# Patient Record
Sex: Male | Born: 1975 | Race: White | Hispanic: No | Marital: Single
Health system: Midwestern US, Academic
[De-identification: ages and names within clinical notes are randomized; demographics above are authoritative.]

## PROBLEM LIST (undated history)

## (undated) LAB — GLUCOSE POC NURSING
POC Glucose: 124
POC Glucose: 153
POC Glucose: 224

---

## 2005-06-21 NOTE — Unmapped (Signed)
THE [facility] PATIENT NAME:   Paul Maynard, Paul Maynard              MR #:  [id number] DATE OF BIRTH:  [birth year]                           ACCOUNT #:  [id number] ED PHYSICIAN:   Salvatore Decent. Marylou Flesher, M.D.             ROOM #: PRIMARY:        Scarlette Calico, M.D.             NURSING   UNIT:  ED REFERRING:      Selected Referral Pt               FC:  M DICTATED   BY:    Marcos Eke, M.D.                     ADMIT DATE:  06/21/2005 VISIT DATE:       06/21/2005                         DISCHARGE DATE:                         EMERGENCY DEPARTMENT DISCHARGE NOTE CHIEF COMPLAINT:  Boil. HISTORY OF   PRESENT ILLNESS:  This is a 30 year old, otherwise healthy male who comes in   today with a left buttocks abscess.  He says that this developed over the past   three to four days.  He has been afebrile and has had abscesses before.    Aside from the abscess he has no complaints. He notes significant pain when he   is sitting on the abscess and notes that he does not have any current   abscesses.  He denies any other recent sick contacts.  Denies any recent   animal or spider bites and has no other complaints. REVIEW OF SYSTEMS:    Positive for left buttocks abscess.  Negative for difficulty with bowel   movements.  Negative for difficulty urinating. Negative for chest pain,   shortness of breath, other abscesses, rash.  All other systems are negative.   PAST MEDICAL HISTORY:  None. PAST SURGICAL HISTORY:  None. ALLERGIES:  None.   MEDICATIONS:  None. SOCIAL HISTORY:  The patient denies tobacco, alcohol or   drugs. PHYSICAL EXAMINATION: VITAL SIGNS:  Blood pressure 126/73, pulse 95,   respiratory rate 16, temperature 99.0, O2 sat 97% on room air. GENERAL:  This   is a well-appearing male in no apparent distress. SKIN:  Focused physical   exam, left buttocks, the patient has a 3 cm x 3 cm area of induration on his   left buttocks.  It does not extend into or around the rectum.  There is a   minimal  area of fluctuance noted. EMERGENCY DEPARTMENT COURSE:  The patient   was seen and evaluated by myself and the attending.  Given the buttocks   abscess, I performed a rectal exam.  I did not note any perirectal or perianal   involvement.  I then performed an incision and drainage of the buttocks   abscess after anesthetizing it with 1% lidocaine with epinephrine (please see   procedure note below). I have given the patient Bactrim and Keflex here given   the fact that he has a  history of abscesses and we also gave him one liter of   normal saline and Phenergan because of the fact that he was feeling some   nausea at the thought of the abscess. The patient will return in five days and   either be rechecked here or at his primary care physician. LABORATORY DATA:    Wound culture pending. MEDICAL DECISION MAKING:  This is a 30 year old male   with a left buttocks abscess that has been cultured.  There is a small amount   of drainage and so he is started on Keflex and Bactrim.  He will followup with   either [place] or his primary care physician in five days for wound recheck and   packing material removal. INVASIVE PROCEDURE NOTE:  Abscess drainage.   INDICATIONS:  Abscess. DETAILS OF PROCEDURE:  Preparation:  The area was   anesthetized with 1% lidocaine with epinephrine and cleaned with chlorhexidine   scrub.  Using a #11 scalpel I incised a 1 cm incision into the middle of the   abscess and drained a small amount of pus and sent a wound culture.  I then   packed it with approximately 5 cm of packing material and dressed it   sterilely. COMPLICATIONS:  None. ESTIMATED BLOOD LOSS:  2 cc. DIAGNOSIS: 1.   Abscess. CONDITION:  Stable. DISPOSITION:  Home. PLAN: 1. Followup as   necessary with primary care physician or the emergency    department in five   days for a wound recheck. 2. Keflex and Bactrim for seven days.                                         ________________________________________ AR/dla                                   ____ D:  06/21/2005 11:46                  Marcos Eke,   M.D. T:  06/21/2005 15:58 Job #:  [id number]                                         ________________________________________                                         ____                                       Salvatore Decent. Marylou Flesher, M.D.                         EMERGENCY DEPARTMENT DISCHARGE NOTE                                         COPY                   PAGE    1 of 1
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
## 2005-10-09 NOTE — Unmapped (Signed)
THE [facility] PATIENT NAME:   Paul Maynard, Paul Maynard                 MR #:  [id number] DATE OF BIRTH:  10-Mar-1975                           ACCOUNT #:  [id number] ED PHYSICIAN:   Abelardo Diesel, M.D.                ROOM #: PRIMARY:        Scarlette Calico, M.D.             NURSING   UNIT:  ED REFERRING:      Referring Nonstaff                 FC:  M DICTATED   BY:    Abelardo Diesel, M.D.              ADMIT DATE:  10/09/2005 VISIT DATE:                                          DISCHARGE DATE:                         EMERGENCY DEPARTMENT DISCHARGE NOTE CHIEF COMPLAINT:  Neck pain. HISTORY OF   PRESENT ILLNESS:  This is a 30 year old gentleman who states that he was   picking his cousin up out of a wheelchair, who weighs approximately 90 pounds,   and strained his neck.  He denies any other blunt trauma, chest pain,   shortness of breath, abdominal pain, numbness, tingling or weakness.  He   describes the pain as 10/10 and worse with movement.  Relieved with rest.  He   has taken over-the-counter Motrin because he has taken this also for his hip.   He denies any fevers, chest pain, shortness of breath, numbness, tingling, or   weakness. REVIEW OF SYSTEMS:  All other review of systems are negative. PAST   MEDICAL HISTORY: 1. Chronic hip pain. MEDICATIONS: 1. Motrin. ALLERGIES:    None. SOCIAL HISTORY:  The patient denies tobacco, ethanol or drug use.   PHYSICAL EXAMINATION: VITAL SIGNS:  Blood pressure 129/79, pulse 96,   respirations 18, temperature 97.2, O2 saturation 97%. GENERAL:    Well-developed, well-nourished gentleman in no significant distress. NECK:    Supple.  Full range of motion, somewhat limited by pain.  No midline spinous   process tenderness.  The patient appears somewhat stiff, however, does not   have any palpable paraspinal musculature spasm. CARDIOVASCULAR:  Regular rate   and rhythm.  No murmurs. CHEST:  Clear to auscultation bilaterally.  No   wheezes, rubs or  rhonchi. ABDOMEN:  Very soft, nontender, nondistended,   normoactive bowel sounds. EXTREMITIES:  The patient has mild tenderness of the   right hip with flexion and extension and otherwise negative. EXTREMITIES:  No   clubbing, cyanosis, or edema. SKIN:  No rashes or petechia. NEUROLOGIC:  The   patient has equal strength in bilateral upper and lower extremities, equal   reflexes.  No sensory deficit noted. PSYCHIATRIC:  Appropriate mood and   affect. MEDICAL DECISION MAKING:  The patient's medical records were reviewed.    It appears as though he comes to the emergency department approximately  twice per month for pain in his right hip.  The patient is currently under the   care of Dr. Hedy Jacob, as well as will have an appointment with orthopedics   for chronic hip pain.  Currently, the patient has no significant inciting   trauma or focal neurologic deficit that would suggest the need for   radiography. ASSESSMENT: 1. Acute neck strain. PLAN: 1. The patient will be   discharged home. 2. The patient will be given prescription for Motrin 800 mg   one by mouth    every eight hours as needed for pain. 3. Flexeril 10 mg one by   mouth three times per day as needed for spasm. DISPOSITION:  The patient was   discharged home in stable condition to follow up with Dr. Hedy Jacob,   orthopedics, physical medicine, and rehabilitation.                                         ________________________________________ AB/rr                                   ____ D:  10/09/2005 15:45                  Abelardo Diesel, M.D.   T:  10/09/2005 22:59 Job #:  [id number]                       EMERGENCY   DEPARTMENT DISCHARGE NOTE                                       COPY                     PAGE    1 of 1                                       COPY                     PAGE    1 of 1
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
## 2007-08-16 NOTE — Unmapped (Signed)
Signed by   LinkLogic on 08/17/2007 at 11:23:27  Patient: Paul Maynard  Note: All result statuses are Final unless otherwise noted.    Tests: (1) DIAG-SPINE ENTIRE SURV AP&LAT [phone])    Order NotePricilla Handler Order Number: [id number]    Order Note:     *** VERIFIED [facility]  Reason:  FALL FROM LADDER/PAIN  Dict.Staff: Lum Babe [phone]  Dict.Res: Caryl Asp [id number]  Verified By: Lum Babe    Ver: 08/17/07  11:23 am  Exams:  DIAG-CHEST PA OR AP    Single radiograph of the chest, 2 radiographs of the left knee,  3 radiographs of the cervical spine, 2 radiographs of the  thoracic spine, and 2 radiographs of the lumbar spine dated  08/16/2007.    Indication: Fall from ladder.    Comparison: Lumbar spine radiographs dated 09/06/2005, left knee  radiographs dated 04/28/2005.    Findings chest:  There is no focal consolidation, pleural effusion, or evidence  of pneumothorax. There is a well-defined 7 mm density in the  right lower lobe, likely representing a calcified granuloma. The  cardiomediastinal silhouette is normal in appearance. The  visualized osseous structures demonstrate no acute abnormalities.    Impression chest:  1. No acute cardiopulmonary disease.        Findings left knee:  There is osteophytosis involving the patella. There is sclerosis  of the patella. Additionally, there is osteophytosis involving  the medial and lateral compartments. There are multiple well  corticated ossific fragments adjacent to the superior lateral  femoral condyle as well there is a smaller ossific fragment in  the medial compartment. There is ossific fragments posterior to  the proximal fibula. These likely represent loose bodies. There  is no evidence of acute fracture or dislocation.    Impression left knee:  1. No evidence of acute fracture or dislocation.  2. Moderate anterior compartment arthrosis and mild medial and  lateral compartment arthrosis.      Findings cervical spine:  Skullbase  to C5 are visualized. The open-mouth odontoid view is  limited. There is normal alignment of the visualized cervical  spine. Note is normal vertebral body and disc space heights.  There is no prevertebral soft tissue swelling. There is no  evidence of fracture or subluxation involving the visualized  cervical spine.    Impression cervical spine:  Incomplete evaluation demonstrates no acute fracture or  subluxation of the visualized cervical spine.      Findings thoracic spine:  The inferior most completely visualized vertebral body on the  lateral radiograph is presumably the T12 vertebral body. In  which case, T4-T12 vertebral bodies are visualized with markedly  limited visualization of the posterior elements of the superior  most thoracic vertebral bodies. There is grossly normal  alignment of the visualized thoracic spine. There is normal  vertebral body and disc space heights. Evaluation for subtle  fractures is markedly limited.    Impression thoracic spine:  Limited examination of the visualized thoracic spine    demonstrates no gross subluxation or compression fracture.      Findings lumbar spine:  T11 through S1 are visualized. There are 5 nonrib-bearing lumbar  vertebrae. There is normal alignment of the lumbar spine. There  is normal vertebral body and disc space heights. Surgical  fixation is partially visualized at the S1-S2 level. There is  facet arthropathy involving L5-S1. There is no evidence of acute  fracture or subluxation.    Impression lumbar spine:  1. No definite acute fracture or subluxation.  2. Surgical fixation, involving the right hemipelvis.  3. L5-S1 facet arthropathy.        The Initial report of this examination was provided by a  ***Resident Radiologist***    This report becomes final only after image review and verified  report signature by the Staff Radiologist.  **** end of result ****    Order Note:   EMR Routing to: Amyrah Pinkhasov, Oris Drone, MD - ordering - [id number]    Note: An  exclamation mark (!) indicates a result that was not dispersed into   the flowsheet.  Document Creation Date: 08/17/2007 11:23 AM  _______________________________________________________________________    (1) Order result status: Final  Collection or observation date-time: 08/16/2007 16:51:21  Requested date-time: 08/16/2007 16:14:00  Receipt date-time:   Reported date-time: 08/17/2007 11:23:21  Referring Physician: SELECTED REFERRAL PT  Ordering Physician: Daron Offer (BALESBD)  Specimen Source:   Source: QRS  Filler Order Number: [id number]  Lab site: [facility]

## 2007-08-16 NOTE — Unmapped (Signed)
Signed by   LinkLogic on 08/16/2007 at 21:11:32  Patient: Paul Maynard  Note: All result statuses are Final unless otherwise noted.    Tests: (1)  (MR)    Order Note:                                   THE [facility]     PATIENT NAME:   Paul Maynard, Paul Maynard                MR #:  [id number]  DATE OF BIRTH:  October 08, 1975                        ACCOUNT #:  [id number]  ED PHYSICIAN:   Gareth Morgan, M.D.              ROOM #:  PRIMARY:        Marya Landry. Lenise Arena, M.D.            NURSING UNIT:  ED  REFERRING:      Selected Referral Pt              FC:  G  DICTATED BY:    Oris Drone. Cyndi Bender, M.D.              ADMIT DATE:  08/16/2007  VISIT DATE:     08/16/2007                        DISCHARGE DATE:                           EMERGENCY DEPARTMENT ADMISSION NOTE        CHIEF COMPLAINT:  Back and left knee pain status post fall.     HISTORY OF PRESENT ILLNESS:  The patient is a 32 year old male who states  that he was up working on some gutters today.  He states that he fell off the  top of a ladder.  He is unsure exactly how tall the ladder was, that he was  up around one story high.  The patient states that he fell backwards onto his  back.  He did not hit his head.  There was no loss of consciousness.  He  states that he was able to get up and walk around at the scene.  He states  that he had some pain in his back, which was diffuse in nature.  He has had  no numbness or tingling in his upper or lower extremities.  He has since had  no urinary or bowel incontinence.  He states that he simply had pain in his  back and felt that he would need further evaluation and was transferred to  the emergency department by his daughter in private vehicle.  He currently  states that his pain is a 7/10 located in his back diffusely.  He states that  he has had no recent illness and is otherwise well.     PAST MEDICAL HISTORY:  None.     MEDICATIONS:  None.     ALLERGIES:  None.     FAMILY HISTORY:  Noncontributory.     SOCIAL HISTORY:  The  patient denies tobacco, alcohol or illicit drug use.     REVIEW OF SYSTEMS:  As per HPI, other systems reviewed and negative.     PHYSICAL EXAMINATION:  VITAL SIGNS:  Temperature 97.1, blood pressure 119/81, pulse 76, respiratory  rate 18, pulse ox 100% on room air.  GENERAL:  The patient is awake, alert, oriented x3, appears to be in no acute  distress.  HEENT:  His pupils are equal and reactive to light bilaterally.  Extraocular  muscles intact.  Nares and oropharynx clear.  NECK:  Noted to be in a C-collar.  When the patient is laid flat and the  cervical spine is palpated he does have some diffuse tenderness most notably  at the most inferior cervical spine.  He does state that he was able to  rotate his neck prior to arriving in the emergency department.  CHEST:  Clear to auscultation bilaterally, no wheezes, rhonchi or rales.  There is no evidence of trauma.  CARDIOVASCULAR:  Regular rate and rhythm, no murmurs, rubs, gallops.  He has  2+ pedal pulses noted bilaterally.  ABDOMEN:  Soft, nontender, nondistended, without rebound or guarding.  Once  again, no evidence of trauma.  Bowel sounds are heard in all four quadrants.  EXTREMITIES:  Warm and well-perfused.  No pitting, cyanosis, edema.  There is  no notable bony injury or gross deformity on physical examination.  He has  full range of motion in his upper and lower extremities bilaterally.  NEUROLOGIC:  Cranial nerves II through XII are intact.  He has 5/5 strength  in his upper and lower extremities bilaterally.  Was noted to have a normal  gait prior to my evaluation while he was walking here in the emergency  department.  He has 2+ reflexes in upper and lower extremities.     EMERGENCY DEPARTMENT COURSE:  The patient was seen and evaluated by myself  and the attending.  History and physical obtained.  Plan discussed and agreed  upon.     X-RAYS/CT SCANS:  The patient had films of his cervical, thoracic, lumbar and  sacral spine as well as a chest  x-ray and a left knee film, as he did state  that he had some left knee pain.  All of the patient's films appear to be  negative, though his cervical spine films were inadequate beyond the level of  C5.  Therefore, he has a CT of his C-spine pending.  If this is negative, I  do feel that the patient will be discharged  home in stable condition.     IMPRESSION:     1. Back pain status post fall.  2. His care will be turned over to the oncoming resident at this time for    follow up of his CT C-spine.  Once again, he is neurovascularly intact.  He    has no notable evidence of trauma on physical examination.  His vital signs    are within normal limits.  He is well-appearing.  He will be given strict    instructions to return for any worsening symptoms or concerns including    numbness, tingling of the upper or lower extremities, bowel or bladder    incontinence, any dizziness, lightheadedness or changes in mental status.                                                       _______________________________________  BDB/plh  _____  D:  08/16/2007 20:24                  Oris Drone. Cyndi Bender, M.D.  T:  08/16/2007 20:58  Job #:  [id number]                                        _______________________________________                                         _____                                        Gareth Morgan, M.D.                              EMERGENCY DEPARTMENT ADMISSION NOTE                                                               PAGE    1 of   1    Note: An exclamation mark (!) indicates a result that was not dispersed into   the flowsheet.  Document Creation Date: 08/16/2007 9:11 PM  _______________________________________________________________________    (1) Order result status: Final  Collection or observation date-time: 08/16/2007 00:00  Requested date-time:   Receipt date-time:   Reported date-time:   Referring Physician: Selected Pt  Ordering Physician:  Reviewed In Hospital  [facility])  Specimen Source:   Source: DBS  Filler Order Number: [id number]  Lab site:

## 2007-08-16 NOTE — Unmapped (Signed)
THE [facility]     PATIENT NAME:   Paul Maynard, Paul Maynard                MR #:  [id number]   DATE OF BIRTH:  Apr 17, 1975                        ACCOUNT #:  [id number]   ED PHYSICIAN:   Gareth Morgan, M.D.              ROOM #:   PRIMARY:        Marya Landry. Lenise Arena, M.D.            NURSING UNIT:  ED   REFERRING:      Selected Referral Pt              FC:  G   DICTATED BY:    Oris Drone. Cyndi Bender, M.D.              ADMIT DATE:  08/16/2007   VISIT DATE:     08/16/2007                        DISCHARGE DATE:                           EMERGENCY DEPARTMENT ADMISSION NOTE       CHIEF COMPLAINT:  Back and left knee pain status post fall.     HISTORY OF PRESENT ILLNESS:  The patient is a 32 year old male who states   that he was up working on some gutters today.  He states that he fell off the   top of a ladder.  He is unsure exactly how tall the ladder was, that he was   up around one story high.  The patient states that he fell backwards onto his   back.  He did not hit his head.  There was no loss of consciousness.  He   states that he was able to get up and walk around at the scene.  He states   that he had some pain in his back, which was diffuse in nature.  He has had   no numbness or tingling in his upper or lower extremities.  He has since had   no urinary or bowel incontinence.  He states that he simply had pain in his   back and felt that he would need further evaluation and was transferred to   the emergency department by his daughter in private vehicle.  He currently   states that his pain is a 7/10 located in his back diffusely.  He states that   he has had no recent illness and is otherwise well.     PAST MEDICAL HISTORY:  None.     MEDICATIONS:  None.     ALLERGIES:  None.     FAMILY HISTORY:  Noncontributory.     SOCIAL HISTORY:  The patient denies tobacco, alcohol or illicit drug use.     REVIEW OF SYSTEMS:  As per HPI, other systems reviewed and negative.     PHYSICAL EXAMINATION:     VITAL SIGNS:  Temperature 97.1, blood pressure 119/81, pulse 76, respiratory   rate 18, pulse ox 100% on room air.   GENERAL:  The patient is awake, alert, oriented x3, appears to be in no acute   distress.   HEENT:  His pupils are equal  and reactive to light bilaterally.  Extraocular   muscles intact.  Nares and oropharynx clear.   NECK:  Noted to be in a C-collar.  When the patient is laid flat and the   cervical spine is palpated he does have some diffuse tenderness most notably   at the most inferior cervical spine.  He does state that he was able to   rotate his neck prior to arriving in the emergency department.   CHEST:  Clear to auscultation bilaterally, no wheezes, rhonchi or rales.   There is no evidence of trauma.   CARDIOVASCULAR:  Regular rate and rhythm, no murmurs, rubs, gallops.  He has   2+ pedal pulses noted bilaterally.   ABDOMEN:  Soft, nontender, nondistended, without rebound or guarding.  Once   again, no evidence of trauma.  Bowel sounds are heard in all four quadrants.   EXTREMITIES:  Warm and well-perfused.  No pitting, cyanosis, edema.  There is   no notable bony injury or gross deformity on physical examination.  He has   full range of motion in his upper and lower extremities bilaterally.   NEUROLOGIC:  Cranial nerves II through XII are intact.  He has 5/5 strength   in his upper and lower extremities bilaterally.  Was noted to have a normal   gait prior to my evaluation while he was walking here in the emergency   department.  He has 2+ reflexes in upper and lower extremities.     EMERGENCY DEPARTMENT COURSE:  The patient was seen and evaluated by myself   and the attending.  History and physical obtained.  Plan discussed and agreed   upon.     X-RAYS/CT SCANS:  The patient had films of his cervical, thoracic, lumbar and   sacral spine as well as a chest x-ray and a left knee film, as he did state   that he had some left knee pain.  All of the patient's films appear to be   negative,  though his cervical spine films were inadequate beyond the level of   C5.  Therefore, he has a CT of his C-spine pending.  If this is negative, I   do feel that the patient will be discharged  home in stable condition.     IMPRESSION:     1. Back pain status post fall.   2. His care will be turned over to the oncoming resident at this time for     follow up of his CT C-spine.  Once again, he is neurovascularly intact.  He     has no notable evidence of trauma on physical examination.  His vital signs     are within normal limits.  He is well-appearing.  He will be given strict     instructions to return for any worsening symptoms or concerns including     numbness, tingling of the upper or lower extremities, bowel or bladder     incontinence, any dizziness, lightheadedness or changes in mental status.                                                   _______________________________________   BDB/plh  _____   D:  08/16/2007 20:24                  Oris Drone. Cyndi Bender, M.D.   T:  08/16/2007 20:58   Job #:  [id number]                                         _______________________________________                                          _____                                         Gareth Morgan, M.D.                             EMERGENCY DEPARTMENT ADMISSION NOTE                                                                PAGE    1 of   1                                                                PAGE    1 of   1

## 2007-08-16 NOTE — Unmapped (Signed)
Signed by   LinkLogic on 08/17/2007 at 07:53:24  Patient: Paul Maynard  Note: All result statuses are Final unless otherwise noted.    Tests: (1) CT-C SPINE W/O CONTRAST ([id number])    Order NotePricilla Handler Order Number: [id number]    Order Note:     *** VERIFIED ***         ADDENDED REPORT     ADDENDUM# 1  [facility]  Reason:  FALL, MIDLINE NECK PAIN  Dict.Staff: Jena Gauss [phone]  Dict.Res: Alba Cory [id number]  Verified By: Jena Gauss           Ver: 08/17/07   7:53 am  Exams:  CT-C SPINE W/O CONTRAST      Addendum:    The images have been reviewed and there is agreement with the  preliminary findings as described by the resident.  *** VERIFIED ***         ADDENDED REPORT     ADDENDUM# 0  [facility]  Reason:  FALL, MIDLINE NECK PAIN  Dict.Staff: PRELIMINARY, TUH TUHPLIM  Dict.Res: Alba Cory [id number]  Verified By: Corene Cornea       Ver: 08/17/07   6:38 am  Exams:  CT-C SPINE W/O CONTRAST      CT scan of the cervical spine without contrast dated 08/16/2007.    Indication:  32 year old male with fall and midline neck pain.    Comparison:  None    Technique: Axial images were obtained from the skull base to the  mid body of T4.  Coronal and sagittal reconstructions were  performed. No contrast was administered.    Findings:    There is normal craniocervical alignment.  The occipital  condyles appear normal.  The visualized vertebral bodies  demonstrate normal height and alignment. The disc spaces are  within normal limits. No fractures are identified. There is  hypertrophic bone about the spinous processes of C7-T2, which  could represent remote traumatic abnormality.    Impression:    1. No acute fracture or malalignment.    The Initial report of this examination was provided by a  ***Resident Radiologist***    The report becomes the final report only after image review and  report signature by the Staff Radiologist.  **** end of result ****    Order Note:   EMR Routing to: Lilyannah Zuelke, Oris Drone, MD - ordering - [id number]    Note: An exclamation mark (!) indicates a result that was not dispersed into   the flowsheet.  Document Creation Date: 08/17/2007 7:53 AM  _______________________________________________________________________    (1) Order result status: Final  Collection or observation date-time: 08/16/2007 19:32:37  Requested date-time: 08/16/2007 18:24:00  Receipt date-time:   Reported date-time: 08/17/2007 07:53:18  Referring Physician: Nelida Gores REFERRAL PT  Ordering Physician: Daron Offer (BALESBD)  Specimen Source:   Source: QRS  Filler Order Number: [id number]  Lab site: [facility]

## 2007-08-16 NOTE — Unmapped (Signed)
Signed by   LinkLogic on 08/17/2007 at 11:23:26  Patient: Paul Maynard  Note: All result statuses are Final unless otherwise noted.    Tests: (1) DIAG-CHEST PA OR AP [phone])    Order NotePricilla Handler Order Number: [id number]    Order Note:     *** VERIFIED [facility]  Reason:  FALL FROM LADDER/PAIN  Dict.Staff: Lum Babe [phone]  Dict.Res: Caryl Asp [id number]  Verified By: Lum Babe    Ver: 08/17/07  11:23 am  Exams:  DIAG-CHEST PA OR AP    Single radiograph of the chest, 2 radiographs of the left knee,  3 radiographs of the cervical spine, 2 radiographs of the  thoracic spine, and 2 radiographs of the lumbar spine dated  08/16/2007.    Indication: Fall from ladder.    Comparison: Lumbar spine radiographs dated 09/06/2005, left knee  radiographs dated 04/28/2005.    Findings chest:  There is no focal consolidation, pleural effusion, or evidence  of pneumothorax. There is a well-defined 7 mm density in the  right lower lobe, likely representing a calcified granuloma. The  cardiomediastinal silhouette is normal in appearance. The  visualized osseous structures demonstrate no acute abnormalities.    Impression chest:  1. No acute cardiopulmonary disease.        Findings left knee:  There is osteophytosis involving the patella. There is sclerosis  of the patella. Additionally, there is osteophytosis involving  the medial and lateral compartments. There are multiple well  corticated ossific fragments adjacent to the superior lateral  femoral condyle as well there is a smaller ossific fragment in  the medial compartment. There is ossific fragments posterior to  the proximal fibula. These likely represent loose bodies. There  is no evidence of acute fracture or dislocation.    Impression left knee:  1. No evidence of acute fracture or dislocation.  2. Moderate anterior compartment arthrosis and mild medial and  lateral compartment arthrosis.      Findings cervical spine:  Skullbase to C5 are  visualized. The open-mouth odontoid view is  limited. There is normal alignment of the visualized cervical  spine. Note is normal vertebral body and disc space heights.  There is no prevertebral soft tissue swelling. There is no  evidence of fracture or subluxation involving the visualized  cervical spine.    Impression cervical spine:  Incomplete evaluation demonstrates no acute fracture or  subluxation of the visualized cervical spine.      Findings thoracic spine:  The inferior most completely visualized vertebral body on the  lateral radiograph is presumably the T12 vertebral body. In  which case, T4-T12 vertebral bodies are visualized with markedly  limited visualization of the posterior elements of the superior  most thoracic vertebral bodies. There is grossly normal  alignment of the visualized thoracic spine. There is normal  vertebral body and disc space heights. Evaluation for subtle  fractures is markedly limited.    Impression thoracic spine:  Limited examination of the visualized thoracic spine    demonstrates no gross subluxation or compression fracture.      Findings lumbar spine:  T11 through S1 are visualized. There are 5 nonrib-bearing lumbar  vertebrae. There is normal alignment of the lumbar spine. There  is normal vertebral body and disc space heights. Surgical  fixation is partially visualized at the S1-S2 level. There is  facet arthropathy involving L5-S1. There is no evidence of acute  fracture or subluxation.    Impression lumbar spine:  1. No definite acute fracture or subluxation.  2. Surgical fixation, involving the right hemipelvis.  3. L5-S1 facet arthropathy.        The Initial report of this examination was provided by a  ***Resident Radiologist***    This report becomes final only after image review and verified  report signature by the Staff Radiologist.  **** end of result ****    Order Note:   EMR Routing to: Chana Lindstrom, Oris Drone, MD - ordering - [id number]    Note: An exclamation mark  (!) indicates a result that was not dispersed into   the flowsheet.  Document Creation Date: 08/17/2007 11:23 AM  _______________________________________________________________________    (1) Order result status: Final  Collection or observation date-time: 08/16/2007 16:51:11  Requested date-time: 08/16/2007 16:14:00  Receipt date-time:   Reported date-time: 08/17/2007 11:23:21  Referring Physician: SELECTED REFERRAL PT  Ordering Physician: Daron Offer (BALESBD)  Specimen Source:   Source: QRS  Filler Order Number: [id number]  Lab site: [facility]

## 2007-08-16 NOTE — Unmapped (Signed)
Signed by   LinkLogic on 08/17/2007 at 11:23:28  Patient: Paul Maynard  Note: All result statuses are Final unless otherwise noted.    Tests: (1) DIAG-KNEE 1 OR 2-VIEWS LT [phone])    Order NotePricilla Handler Order Number: [id number]    Order Note:     *** VERIFIED [facility]  Reason:  FALL FROM LADDER/PAIN  Dict.Staff: Lum Babe [phone]  Dict.Res: Caryl Asp [id number]  Verified By: Lum Babe    Ver: 08/17/07  11:23 am  Exams:  DIAG-CHEST PA OR AP    Single radiograph of the chest, 2 radiographs of the left knee,  3 radiographs of the cervical spine, 2 radiographs of the  thoracic spine, and 2 radiographs of the lumbar spine dated  08/16/2007.    Indication: Fall from ladder.    Comparison: Lumbar spine radiographs dated 09/06/2005, left knee  radiographs dated 04/28/2005.    Findings chest:  There is no focal consolidation, pleural effusion, or evidence  of pneumothorax. There is a well-defined 7 mm density in the  right lower lobe, likely representing a calcified granuloma. The  cardiomediastinal silhouette is normal in appearance. The  visualized osseous structures demonstrate no acute abnormalities.    Impression chest:  1. No acute cardiopulmonary disease.        Findings left knee:  There is osteophytosis involving the patella. There is sclerosis  of the patella. Additionally, there is osteophytosis involving  the medial and lateral compartments. There are multiple well  corticated ossific fragments adjacent to the superior lateral  femoral condyle as well there is a smaller ossific fragment in  the medial compartment. There is ossific fragments posterior to  the proximal fibula. These likely represent loose bodies. There  is no evidence of acute fracture or dislocation.    Impression left knee:  1. No evidence of acute fracture or dislocation.  2. Moderate anterior compartment arthrosis and mild medial and  lateral compartment arthrosis.      Findings cervical spine:  Skullbase to  C5 are visualized. The open-mouth odontoid view is  limited. There is normal alignment of the visualized cervical  spine. Note is normal vertebral body and disc space heights.  There is no prevertebral soft tissue swelling. There is no  evidence of fracture or subluxation involving the visualized  cervical spine.    Impression cervical spine:  Incomplete evaluation demonstrates no acute fracture or  subluxation of the visualized cervical spine.      Findings thoracic spine:  The inferior most completely visualized vertebral body on the  lateral radiograph is presumably the T12 vertebral body. In  which case, T4-T12 vertebral bodies are visualized with markedly  limited visualization of the posterior elements of the superior  most thoracic vertebral bodies. There is grossly normal  alignment of the visualized thoracic spine. There is normal  vertebral body and disc space heights. Evaluation for subtle  fractures is markedly limited.    Impression thoracic spine:  Limited examination of the visualized thoracic spine    demonstrates no gross subluxation or compression fracture.      Findings lumbar spine:  T11 through S1 are visualized. There are 5 nonrib-bearing lumbar  vertebrae. There is normal alignment of the lumbar spine. There  is normal vertebral body and disc space heights. Surgical  fixation is partially visualized at the S1-S2 level. There is  facet arthropathy involving L5-S1. There is no evidence of acute  fracture or subluxation.    Impression lumbar  spine:    1. No definite acute fracture or subluxation.  2. Surgical fixation, involving the right hemipelvis.  3. L5-S1 facet arthropathy.        The Initial report of this examination was provided by a  ***Resident Radiologist***    This report becomes final only after image review and verified  report signature by the Staff Radiologist.  **** end of result ****    Order Note:   EMR Routing to: Alberto Pina, Oris Drone, MD - ordering - [id number]    Note: An exclamation  mark (!) indicates a result that was not dispersed into   the flowsheet.  Document Creation Date: 08/17/2007 11:23 AM  _______________________________________________________________________    (1) Order result status: Final  Collection or observation date-time: 08/16/2007 16:51:32  Requested date-time: 08/16/2007 16:14:00  Receipt date-time:   Reported date-time: 08/17/2007 11:23:21  Referring Physician: SELECTED REFERRAL PT  Ordering Physician: Daron Offer (BALESBD)  Specimen Source:   Source: QRS  Filler Order Number: [id number]  Lab site: [facility]

## 2007-08-17 NOTE — Unmapped (Signed)
THE [facility]     PATIENT NAME:   Paul Maynard, Paul Maynard                MR #:  [id number]   DATE OF BIRTH:  04/25/1975                        ACCOUNT #:  [id number]   ED PHYSICIAN:   Attending Physician, M.D.         ROOM #:   PRIMARY:        Marya Landry. Lenise Arena, M.D.            NURSING UNIT:  ED   REFERRING:      Selected Referral Pt              FC:  G   DICTATED BY:    Lawernce Ion, M.D.          ADMIT DATE:  08/16/2007   VISIT DATE:                                       DISCHARGE DATE:                           EMERGENCY DEPARTMENT DISCHARGE NOTE       ***THIS IS AN ADDENDUM***  nwc  08/17/2007     Of note, this patient has been previously seen by Dr. Cyndi Bender.     In brief, this is a gentleman who had fallen off a ladder about an hour prior   to arrival who was complaining of some back pain.  He had had plain films of   his C-spine, which were inadequate and thus was evaluated ____, and thus   received a CT of the C-spine.  The patient did have a CT of the C-spine,   which showed no acute fracture or malalignment.  At this point in time, the   patient was discharged home.  He was told not to drive home from the   emergency department as he had received pain medication here.  He was also   given a prescription for Vicodin for pain and told not to drive after taking   that.  The patient was given strict precautions by both myself, as well as   Dr. Cyndi Bender and the patient was also told he could follow up with his doctor.     DIAGNOSIS:     1. Status post fall.     DISPOSITION:  The patient looks well, ambulating without difficulty.     PLAN:     1. He was given a prescription for Vicodin and the above-mentioned     precautions.                                                       _______________________________________   VGC/nwc                                _____   D:  08/17/2007 09:51                  [place]  Signe Colt, M.D.   T:  08/17/2007 19:47   Job #:  [id number]                          _______________________________________                                          _____                                         Attending Physician, M.D.                             EMERGENCY DEPARTMENT DISCHARGE NOTE                                                                PAGE    1 of   1                                                                PAGE    1 of   1

## 2007-08-17 NOTE — Unmapped (Signed)
Signed by   LinkLogic on 08/17/2007 at 19:57:16  Patient: Paul Maynard  Note: All result statuses are Final unless otherwise noted.    Tests: (1)  (MR)    Order Note:                                   [facility]     PATIENT NAME:   [name], [name]                MR #:  [id number]  DATE OF BIRTH:  1975-12-16                        ACCOUNT #:  [id number]  ED PHYSICIAN:   Attending Physician, M.D.         ROOM #:  PRIMARY:        Marya Landry. Lenise Arena, M.D.            NURSING UNIT:  ED  REFERRING:      Selected Referral Pt              FC:  G  DICTATED BY:    Lawernce Ion, M.D.          ADMIT DATE:  08/16/2007  VISIT DATE:                                       DISCHARGE DATE:                           EMERGENCY DEPARTMENT DISCHARGE NOTE        ***THIS IS AN ADDENDUM***  nwc  08/17/2007     Of note, this patient has been previously seen by Dr. Cyndi Bender.     In brief, this is a gentleman who had fallen off a ladder about an hour prior  to arrival who was complaining of some back pain.  He had had plain films of  his C-spine, which were inadequate and thus was evaluated ____, and thus  received a CT of the C-spine.  The patient did have a CT of the C-spine,  which showed no acute fracture or malalignment.  At this point in time, the  patient was discharged home.  He was told not to drive home from the  emergency department as he had received pain medication here.  He was also  given a prescription for Vicodin for pain and told not to drive after taking  that.  The patient was given strict precautions by both myself, as well as  Dr. Cyndi Bender and the patient was also told he could follow up with his doctor.     DIAGNOSIS:     1. Status post fall.     DISPOSITION:  The patient looks well, ambulating without difficulty.     PLAN:     1. He was given a prescription for Vicodin and the above-mentioned    precautions.  _______________________________________  VGC/nwc                                _____  D:  08/17/2007 09:51                  Lawernce Ion, M.D.  T:  08/17/2007 19:47  Job #:  [id number]                                        _______________________________________                                         _____                                        Attending Physician, M.D.                              EMERGENCY DEPARTMENT DISCHARGE NOTE                                                               PAGE    1 of   1    Note: An exclamation mark (!) indicates a result that was not dispersed into   the flowsheet.  Document Creation Date: 08/17/2007 7:57 PM  _______________________________________________________________________    (1) Order result status: Final  Collection or observation date-time: 08/17/2007 00:00  Requested date-time:   Receipt date-time:   Reported date-time:   Referring Physician: Selected Pt  Ordering Physician:  [facility]ed [facility])  Specimen Source:   Source: DBS  Filler Order Number: [id number]  Lab site:

## 2008-05-04 NOTE — Unmapped (Signed)
THE [facility]     PATIENT NAME:   Paul Maynard, Paul Maynard                MR #:  [id number]   DATE OF BIRTH:  1975/08/30                        ACCOUNT #:  [id number]   ED PHYSICIAN:   Margit Banda. Aviva Kluver, M.D.             ROOM #:   PRIMARY:        Marya Landry. Lenise Arena, M.D.            NURSING UNIT:  ED   REFERRING:      Selected Referral Pt              FC:  M   DICTATED BY:    Margit Banda. Aviva Kluver, M.D.             ADMIT DATE:  05/03/2008   VISIT DATE:     05/03/2008                        DISCHARGE DATE:                           EMERGENCY DEPARTMENT DISCHARGE NOTE       CHIEF COMPLAINT:  Left arm pain.     HISTORY OF PRESENT ILLNESS:  This is a 33 year old gentleman with no   significant past medical history presenting to the emergency department   stating that over the past three days he has developed a small red area in   his left antecubital fossa.  He states that the area in question has been   warm, painful and has had some minor swelling.  He reports a similar   appearance to a cellulitic infection he had in his past.  He describes pain   associated with this, approximately 5/10 in severity, located at the left   antecubital fossa.  The pain does not radiate.  The pain is provoked by   manipulation.  It is relieved by resting the arm in the neutral position.  He   describes it as a throbbing sensation but otherwise not associated with any   other symptoms.     REVIEW OF SYSTEMS:  He reports redness and pain in the left antecubital   fossa.  He denies fevers or chills.  There has been no nausea or vomiting, no   cold or influenza symptoms.  No chest pain or difficulties breathing.  No   abdominal pain, nausea or vomiting.  No changes in bowel or bladder habits.   No peripheral focal numbness or weakness.  The remainder of his reviewed   systems are negative.     PAST MEDICAL HISTORY:  Unremarkable.     ALLERGIES:  He has no known drug allergies.     CURRENT MEDICATIONS:     1.  Flexeril for  back problems.     SOCIAL HISTORY:  Negative for tobacco or alcohol use.     FAMILY HISTORY:  Per his report is unknown.     PHYSICAL EXAMINATION:     VITAL SIGNS:  BP 120/77, pulse 88, respirations 18, temperature 98.2, O2   saturation on room air 97%.   GENERAL:  Awake, alert, oriented x3 and in no apparent distress.   EXTREMITIES:  With emphasis  on his left upper extremity shows an area   approximately the size of a 50 cent piece that is red, indurated and painful   to touch.  There is no fluctuance in or around the area of induration.  He   has full normal peripheral pulses in the upper extremities.   NEUROLOGIC: In his neurologic examination, the upper extremities are within   normal limits.     EMERGENCY DEPARTMENT COURSE:  In the emergency department, he received Keflex   500 mg by mouth, Bactrim Double Strength one tablet orally.  In addition, he   received two Vicodin by mouth for pain relief.     MEDICAL DECISION MAKING:  Based on the clinical history and physical   examination, I feel that the patient's current condition is consistent with a   left arm cellulitis, specifically located in the antecubital fossa. I   specifically discussed with the patient the possibility of this developing   into a cutaneous abscess, however, it was agreed at this time that we would   try a course of oral antibiotics prior to surgical incision at the wishes of   the patient.  At this time, I again do not feel any fluctuance, simply   induration, and he will begin his oral antibiotics for his cellulitis.     DIAGNOSIS:     1.  Left arm cellulitis.     DISPOSITION:  Home.     PLAN:     1. He was discharged home with a prescription for Keflex 500 mg by mouth four     times a day for the next seven days.   2. Bactrim Double Strength one tablet by mouth two times per day for the next     seven days.   3. Vicodin one tablet by mouth every eight hours as needed for pain.   4. Instructed to follow up with his primary care provider  regarding current     condition.   5. Instructed to return to the emergency department for any worsening of his     condition despite treatment.                                                         _______________________________________   JMD/tlm                                _____   D:  05/03/2008 23:32                  Margit Banda. Aviva Kluver, M.D.   T:  05/04/2008 02:14   Job #:  [id number]                               EMERGENCY DEPARTMENT DISCHARGE NOTE                                                                PAGE    1 of   1  PAGE    1 of   1

## 2008-06-19 NOTE — Unmapped (Signed)
Signed by   [employer] on 06/19/2008 at 22:14:09  Patient: Paul Maynard  Note: All result statuses are Final unless otherwise noted.    Tests: (1)  (MR)    Order Note:                                   THE [facility]     PATIENT NAME:   Paul Maynard, Paul Maynard                MR #:  [id number]  DATE OF BIRTH:  02-12-1975                        ACCOUNT #:  [id number]  ED PHYSICIAN:   Frederik Schmidt, M.D.       ROOM #:  PRIMARY:        Marya Landry. Lenise Arena, M.D.            NURSING UNIT:  ED  REFERRING:      Selected Referral Pt              FC:  S  DICTATED BY:    Fabiola Backer, M.D.                  ADMIT DATE:  06/19/2008  VISIT DATE:     06/19/2008                        DISCHARGE DATE:                           EMERGENCY DEPARTMENT DISCHARGE NOTE        CHIEF COMPLAINT:  Some right hip pain radiating down to his great toe.     HISTORY OF PRESENT ILLNESS:  He states had been present for the last three to  four days.  He has had this in the past when he fell down and this aggravated  his back.  He has no weakness or numbness.  It is a shooting, burning  sensation.  It is 9/10, starts in his right hip, worse with movement.  Nothing relieves it.  Radiating to the right leg.  No fevers, chills, sweats.  No weight loss.  No bladder or bowel incontinence.  No IV drug use or any  other complaints.     REVIEW OF SYSTEMS:  As per HPI, otherwise negative.     PAST MEDICAL/SURGICAL HISTORY:  None.     MEDICATIONS:  None.     ALLERGIES:  None.     SOCIAL HISTORY:  No tobacco, alcohol or drugs.     PHYSICAL EXAMINATION:     VITAL SIGNS:  Blood pressure 126/81, pulse 83, respiratory rate 20,  temperature 97.4, sat is 99% on room air.  GENERAL:  A well-nourished male sitting up, alert, nontoxic.  HEENT:  Head atraumatic.  Pupils equal.  Sclerae nonicteric.  Mucous  membranes are moist.  NECK:  Trachea midline, no JVD or adenopathy.  LUNGS:  Clear without crackles, wheezes or rhonchi.  CARDIOVASCULAR:  S1, S2.  No rub, murmur  or gallop.  ABDOMEN:  Soft, nontender, nondistended.  MUSCULOSKELETAL:  No C, T or L-spine midline bony tenderness, step-off or  deformity.  He has some bilateral lumbar muscular discomfort.  Lower  extremities are symmetrical, 2+ pulses.  Sensation and strength is intact.  He has no clonus.  He has 2+ patellar and 1+ Achilles reflexes.  Negative  straight leg raise test, able to stand and ambulate from bed without  difficulty.  No ataxia.  SKIN:  Warm, pink, dry, no rashes.  NEUROLOGIC:  Alert and oriented x 3/3.  Normal speech, normal gait.  PSYCHIATRIC:  Calm and cooperative.     EMERGENCY DEPARTMENT COURSE:  History and physical performed upon arrival.  The patient seen by myself and attending.  Assessment and plan developed,  discussed and agreed upon.  Given two tabs of oral Percocet for pain relief,  improved, given discharge instructions, return precautions, follow-up  instructions and discharged home.     MEDICAL DECISION MAKING:  A 33 year old male with findings on exam consistent  with radiculopathy.  He has no known lumbar disk disease; however, given his  low back pain following a fall is consistent with a strain with some  radiculopathy.  He has no motor symptoms.  He has no central cord symptoms.  He has no bony midline tenderness to suggest a fracture.  I believe he is  safe for outpatient care at this point in time.     FINAL DIAGNOSIS:     1.  Right lower extremity radiculopathy consistent with sciatica.     DISPOSITION:  Home in good condition.     PLAN:     1. Rest, ice.  2. Ibuprofen, Tylenol over-the-counter as needed.  3. Follow up with [facility].  4. Given return precautions.                                                    _______________________________________  TD/jf                                  _____  D:  06/19/2008 14:24                  Fabiola Backer, M.D.  T:  06/19/2008 22:06  Job #:  [id number]                                         _______________________________________                                         _____                                        Frederik Schmidt, M.D.                              EMERGENCY DEPARTMENT DISCHARGE NOTE  PAGE    1 of   1    Note: An exclamation mark (!) indicates a result that was not dispersed into   the flowsheet.  Document Creation Date: 06/19/2008 10:14 PM  _______________________________________________________________________    (1) Order result status: Final  Collection or observation date-time: 06/19/2008 00:00  Requested date-time:   Receipt date-time:   Reported date-time:   Referring Physician: Selected Pt  Ordering Physician:  Reviewed In [facility])  Specimen Source:   Source: DBS  Filler Order Number: [id number]  Lab site:

## 2008-06-19 NOTE — Unmapped (Signed)
THE [facility]     PATIENT NAME:   CRAWFORD, TAMURA                MR #:  [id number]   DATE OF BIRTH:  24-May-1975                        ACCOUNT #:  [id number]   ED PHYSICIAN:   Frederik Schmidt, M.D.       ROOM #:   PRIMARY:        Marya Landry. Lenise Arena, M.D.            NURSING UNIT:  ED   REFERRING:      Selected Referral Pt              FC:  S   DICTATED BY:    Fabiola Backer, M.D.                  ADMIT DATE:  06/19/2008   VISIT DATE:     06/19/2008                        DISCHARGE DATE:                           EMERGENCY DEPARTMENT DISCHARGE NOTE       CHIEF COMPLAINT:  Some right hip pain radiating down to his great toe.     HISTORY OF PRESENT ILLNESS:  He states had been present for the last three to   four days.  He has had this in the past when he fell down and this aggravated   his back.  He has no weakness or numbness.  It is a shooting, burning   sensation.  It is 9/10, starts in his right hip, worse with movement.   Nothing relieves it.  Radiating to the right leg.  No fevers, chills, sweats.   No weight loss.  No bladder or bowel incontinence.  No IV drug use or any   other complaints.     REVIEW OF SYSTEMS:  As per HPI, otherwise negative.     PAST MEDICAL/SURGICAL HISTORY:  None.     MEDICATIONS:  None.     ALLERGIES:  None.     SOCIAL HISTORY:  No tobacco, alcohol or drugs.     PHYSICAL EXAMINATION:     VITAL SIGNS:  Blood pressure 126/81, pulse 83, respiratory rate 20,   temperature 97.4, sat is 99% on room air.   GENERAL:  A well-nourished male sitting up, alert, nontoxic.   HEENT:  Head atraumatic.  Pupils equal.  Sclerae nonicteric.  Mucous   membranes are moist.   NECK:  Trachea midline, no JVD or adenopathy.   LUNGS:  Clear without crackles, wheezes or rhonchi.   CARDIOVASCULAR:  S1, S2.  No rub, murmur or gallop.   ABDOMEN:  Soft, nontender, nondistended.   MUSCULOSKELETAL:  No C, T or L-spine midline bony tenderness, step-off or   deformity.  He has some  bilateral lumbar muscular discomfort.  Lower   extremities are symmetrical, 2+ pulses.  Sensation and strength is intact.   He has no clonus.  He has 2+ patellar and 1+ Achilles reflexes.  Negative   straight leg raise test, able to stand and ambulate from bed without   difficulty.  No ataxia.   SKIN:  Warm, pink, dry, no rashes.  NEUROLOGIC:  Alert and oriented x 3/3.  Normal speech, normal gait.   PSYCHIATRIC:  Calm and cooperative.     EMERGENCY DEPARTMENT COURSE:  History and physical performed upon arrival.   The patient seen by myself and attending.  Assessment and plan developed,   discussed and agreed upon.  Given two tabs of oral Percocet for pain relief,   improved, given discharge instructions, return precautions, follow-up   instructions and discharged home.     MEDICAL DECISION MAKING:  A 33 year old male with findings on exam consistent   with radiculopathy.  He has no known lumbar disk disease; however, given his   low back pain following a fall is consistent with a strain with some   radiculopathy.  He has no motor symptoms.  He has no central cord symptoms.   He has no bony midline tenderness to suggest a fracture.  I believe he is   safe for outpatient care at this point in time.     FINAL DIAGNOSIS:     1.  Right lower extremity radiculopathy consistent with sciatica.     DISPOSITION:  Home in good condition.     PLAN:     1. Rest, ice.   2. Ibuprofen, Tylenol over-the-counter as needed.   3. Follow up with [facility].   4. Given return precautions.                                                 _______________________________________   TD/jf                                  _____   D:  06/19/2008 14:24                  Fabiola Backer, M.D.   T:  06/19/2008 22:06   Job #:  [id number]                                         _______________________________________                                          _____                                         Frederik Schmidt, M.D.                              EMERGENCY DEPARTMENT DISCHARGE NOTE                                                                PAGE    1 of   1  PAGE    1 of   1
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
## 2009-08-22 NOTE — Unmapped (Signed)
THE [facility]     PATIENT NAME:   AIVEN, KAMPE                MR #:  [id number]   DATE OF BIRTH:  1975-03-15                        ACCOUNT #:  [id number]   ED PHYSICIAN:   Dorris Fetch, M.D.            ROOM #:   PRIMARY:        Marya Landry. Lenise Arena, M.D.            NURSING UNIT:  ED   REFERRING:      Selected Referral Pt              FC:  M   DICTATED BY:    Dorris Fetch, M.D.            ADMIT DATE:  08/06/2009   VISIT DATE:     08/06/2009                        DISCHARGE DATE:                               EMERGENCY DEPARTMENT NOTE     *-*-*     ADDENDUM DICTATED ON 08/22/2009 *** JLI ***     I have seen and evaluated the patient.  I have discussed the patient with the   physician assistant and agree with the assessment and plan.       *-*-*                                             _______________________________________   REG/jli                                _____   D:  08/22/2009 13:02                  Dorris Fetch, M.D.   T:  08/22/2009 20:31   Job #:  [id number]                                    EMERGENCY DEPARTMENT NOTE                                                                PAGE    1 of   1                                                                PAGE    1 of   1
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
## 2011-04-09 ENCOUNTER — Emergency Department: Admit: 2011-04-09

## 2011-04-09 ENCOUNTER — Inpatient Hospital Stay: Admit: 2011-04-09 | Discharge: 2011-04-09 | Disposition: A | Discharge status: Home / Self Care

## 2011-04-09 DIAGNOSIS — S39012A Strain of muscle, fascia and tendon of lower back, initial encounter: Secondary | ICD-10-CM

## 2011-04-09 LAB — POC GLU MONITORING DEVICE: POC Glucose Monitoring Device: 224 mg/dL (ref 65–99)

## 2011-04-09 MED ORDER — oxyCODONE-acetaminophen (PERCOCET) 5-325 mg per tablet 2 tablet
5-325 | Freq: Once | ORAL | Status: AC
Start: 2011-04-09 — End: 2011-04-09
  Administered 2011-04-09: 23:00:00 via ORAL

## 2011-04-09 MED ORDER — oxyCODONE-acetaminophen (PERCOCET) 5-325 mg per tablet
5-325 | ORAL_TABLET | Freq: Four times a day (QID) | ORAL | Status: AC | PRN
Start: 2011-04-09 — End: 2014-07-27

## 2011-04-09 MED FILL — OXYCODONE-ACETAMINOPHEN 5 MG-325 MG TABLET: 5-325 5-325 mg | ORAL | Qty: 2
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
## 2011-04-09 NOTE — Unmapped (Signed)
Back Pain (Lumbosacral Strain)  Back pain is one of the most common causes of pain. There are many causes of back pain. Most are not serious conditions.    CAUSES  Your backbone (spinal column) is made up of 24 main vertebral bodies, the sacrum, and the coccyx. These are held together by muscles and tough, fibrous tissue (ligaments). Nerve roots pass through the openings between the vertebrae. A sudden move or injury to the back may cause injury to, or pressure on, these nerves. This may result in localized back pain or pain movement (radiation) into the buttocks, down the leg, and into the foot. Sharp, shooting pain from the buttock down the back of the leg (sciatica) is frequently associated with a ruptured (herniated) disc. Pain may be caused by muscle spasm alone.  Your caregiver can often find the cause of your pain by the details of your symptoms and an exam. In some cases, you may need tests (such as X-rays). Your caregiver will work with you to decide if any tests are needed based on your specific exam.  HOME CARE INSTRUCTIONS  ?? Avoid an underactive lifestyle. Active exercise, as directed by your caregiver, is your greatest weapon against back pain.   ?? Avoid hard physical activities (tennis, racquetball, water-skiing) if you are not in proper physical condition for it. This may aggravate and/or create problems.   ?? If you have a back problem, avoid sports requiring sudden body movements. Swimming and walking are generally safer activities.   ?? Maintain good posture.   ?? Avoid becoming overweight (obese).   ?? Use bed rest for only the most extreme, sudden (acute) episode. Your caregiver will help you determine how much bed rest is necessary.   ?? For acute conditions, you may put ice on the injured area.   ?? Put ice in a plastic bag.   ?? Place a towel between your skin and the bag.   ?? Leave the ice on for 15 minutes at a time, every 2 hours, or as needed.   ?? After you are improved and more active, it may  help to apply heat for 30 minutes before activities.   See your caregiver if you are having pain that lasts longer than expected. Your caregiver can advise appropriate exercises and/or therapy if needed. With conditioning, most back problems can be avoided.  SEEK IMMEDIATE MEDICAL CARE IF:  ?? You have numbness, tingling, weakness, or problems with the use of your arms or legs.   ?? You experience severe back pain not relieved with medicines.   ?? There is a change in bowel or bladder control.   ?? You have increasing pain in any area of the body, including your belly (abdomen).   ?? You notice shortness of breath, dizziness, or feel faint.   ?? You feel sick to your stomach (nauseous), are throwing up (vomiting), or become sweaty.   ?? You notice discoloration of your toes or legs, or your feet get very cold.   ?? Your back pain is getting worse.   ?? You have an oral temperature above 100, not controlled by medicine.   MAKE SURE YOU:   ?? Understand these instructions.   ?? Will watch your condition.   ?? Will get help right away if you are not doing well or get worse.   Document Released: 10/24/2004 Document Re-Released: 04/10/2009  ExitCare?? Patient Information ??2012 [place], [place].    Physicians and Clinics    Medical care is  best given by a doctor who knows you and your medical history. Therefore, it is best to have a personal physician.    Please choose a physician and call him/her for periodic health checks and when you are ill.    A list of local clinics is provided:    [facility] Department Clinics [facility] residents only)     [facility]. Tam[facility]   [address]   [phone]     [employer] [facility]   [address]   [phone]     [facility]   (entrance on [address])   [address]   [phone]     [facility]   [address]   [phone]     [facility]   [address]   [phone]    [facility]   [address]   [phone]     [facility]   [address]   [phone]     [facility]   [address]   [phone]     [facility]   [address]   [phone]     [facility]   [address]   [phone]     [facility]   [address]   [phone]     [facility]   [address]   [phone]     [facility]   [address]   [phone]    Other Health Clinics     [facility]   [facility]   [address]   [phone]     [facility]   [address]   [phone]     [facility]   [address]   [phone]     [facility]   [address]   [phone]     [facility]   [address]   [phone]    [facility]   [address]   [address]   [phone]     [place]   [address]   [phone]     [place]   [address]   [phone]     [place]   [address]   [phone]     [place]   [address]   [phone]

## 2011-04-09 NOTE — Unmapped (Signed)
FSBS 224

## 2011-06-13 ENCOUNTER — Emergency Department: Admit: 2011-06-13

## 2011-06-13 ENCOUNTER — Inpatient Hospital Stay: Admit: 2011-06-13 | Discharge: 2011-06-13 | Disposition: A | Discharge status: Home / Self Care

## 2011-06-13 DIAGNOSIS — M545 Low back pain, unspecified: Secondary | ICD-10-CM

## 2011-06-13 LAB — POC GLU MONITORING DEVICE: POC Glucose Monitoring Device: 153 mg/dL (ref 65–99)

## 2011-06-13 MED ORDER — diazepam (VALIUM) tablet 5 mg
5 | Freq: Once | ORAL | Status: AC
Start: 2011-06-13 — End: 2011-06-13
  Administered 2011-06-13: 20:00:00 via ORAL

## 2011-06-13 MED ORDER — oxyCODONE-acetaminophen (PERCOCET) 5-325 mg per tablet 2 tablet
5-325 | Freq: Once | ORAL | Status: AC
Start: 2011-06-13 — End: 2011-06-13
  Administered 2011-06-13: 20:00:00 via ORAL

## 2011-06-13 MED ORDER — oxyCODONE (ROXICODONE) 5 MG immediate release tablet
5 | ORAL_TABLET | ORAL | 0.00 refills | 6.00000 days | Status: AC | PRN
Start: 2011-06-13 — End: 2014-07-27

## 2011-06-13 MED ORDER — diazepam (VALIUM) 2 MG tablet
2 | ORAL_TABLET | ORAL | Status: AC
Start: 2011-06-13 — End: 2014-07-27

## 2011-06-13 MED FILL — OXYCODONE-ACETAMINOPHEN 5 MG-325 MG TABLET: 5-325 5-325 mg | ORAL | Qty: 2

## 2011-06-13 MED FILL — DIAZEPAM 5 MG TABLET: 5 5 MG | ORAL | Qty: 1

## 2011-06-13 NOTE — Unmapped (Signed)
C/o lower back pain that radiates down right leg.

## 2011-06-13 NOTE — Unmapped (Signed)
Physicians and Clinics    Medical care is best given by a doctor who knows you and your medical history. Therefore, it is best to have a personal physician.    Please choose a physician and call him/her for periodic health checks and when you are ill.    A list of local clinics is provided:    [facility]ics ([place] residents only)     [facility]   [address]   [phone]     [facility]   [address]   [phone]     [facility]   (entrance on [address]   [address]   [phone]     [facility]   [address]   [phone]     [facility]   [address]   [phone]    [facility]     [facility]   [address]   [phone]     [facility]   [address]   [phone]     [facility]   [address]   [phone]     [place]   [address]   [phone]     [facility]   [address]   [phone]     [facility]   [address]   [phone]     [facility]   [address]   [phone]     [facility]   [address]   [phone]    Other Health Clinics     [facility]   [facility]   [address]   [phone]     [facility]   [address]   [phone]     [facility]   [address]   [phone]     [facility]   [address]   [phone]     [facility]   [address]   [phone]    [facility]     [place]   [address]   [address] 2   [phone]     [place]   [address]   [phone]     [place]   [address]   [phone]     [place]   [address]   [phone]     [place]   [address]   [phone]

## 2011-06-13 NOTE — Unmapped (Signed)
[place] ED Note    Reason for Visit: Back Pain      Patient History     Paul Maynard. is a 36 y.o. male who presented to the emergency department with Back Pain  .  Patient states that for one week he has had back pain.  He states that the pain begins in his mid right parathoracic muscles that extends to his right paralumbar muscles and then to his buttock area.  Patient states that the pain is burning and a shocklike.  It's 9/10 in severity.  He has used heat, ice, ibuprofen, and and pain patches without relief.  The patient denies numbness tingling or weakness in his lower extremities.  He has had an episode like this sometime in the remote past that resolved on its own       Past Medical History   Diagnosis Date   ??? Diabetes mellitus        Past Surgical History   Procedure Date   ??? Hip surgery    ??? Colon surgery    ??? Knee surgery        Patient  reports that he has never smoked. He does not have any smokeless tobacco history on file. He reports that he does not drink alcohol. His drug history not on file.      Previous Medications    METFORMIN (GLUCOPHAGE) 500 MG TABLET    Take 500 mg by mouth 2 times a day with meals.    OXYCODONE-ACETAMINOPHEN (PERCOCET) 5-325 MG PER TABLET    Take 1 tablet by mouth every 6 hours as needed.       Allergies:   Allergies as of 06/13/2011 - Review Complete 06/13/2011   Allergen Reaction Noted   ??? Hydrocodone Itching and Nausea And Vomiting 04/09/2011       Review of Systems     The patient denies bowel or bladder incontinence.  He also denies saddle anesthesia.        Physical Exam     ED Triage Vitals   Vital Signs Group      Temp 06/13/11 1534 97.8 ??F (36.6 ??C)      Temp Source 06/13/11 1534 Oral      Heart Rate 06/13/11 1534 73       Heart Rate Source 06/13/11 1534 Monitor      Resp 06/13/11 1534 16       BP 06/13/11 1534 151/95 mmHg      BP Location 06/13/11 1534 Left arm      BP Method 06/13/11 1534 Automatic    Patient Position 06/13/11 1534 Sitting   SpO2 06/13/11 1534 98 %   O2 Device 06/13/11 1534 None (Room air)         Physical Exam   Constitutional: He appears well-developed and well-nourished. No distress.   Neck: Normal range of motion.   Pulmonary/Chest: Breath sounds normal.   Musculoskeletal:        Exam of the patient's back reveals significant tenderness of his parathoracic and paralumbar muscles.  The pain is increased even with light touch only.  He has a negative straight leg raising bilaterally.  He has 5 x 5 and equal extensor and flexor strength including hip flexors of his lower extremities appear.  He has 2+ and equal deep tendon reflexes at the knees and 1+ and equal deep tendon reflexes at the ankles         Diagnostic Studies     Labs:  Labs Reviewed - No data to display    Radiology: X-rays reveal a thorough no acute findings in the thoracic or lumbar spine.  There is unchanged lower lumbar spondylosis        Emergency Department Procedures     None    ED Course and MDM       The patient's pain is most likely musculoskeletal although the burning and sharp nature of it might be more consistent with diabetic neuropathy-type pain to    Clinical Impression: Back pain    Plan: Percocet for pain.  Valium to 4 mg every 6 hours when necessary spasm.    Disposition: The patient was given the list of local health clinics at his request for followup to      Pasty Arch, MD  06/13/11 [phone]

## 2014-07-27 ENCOUNTER — Emergency Department: Admit: 2014-07-28 | Payer: PRIVATE HEALTH INSURANCE

## 2014-07-27 DIAGNOSIS — S93401A Sprain of unspecified ligament of right ankle, initial encounter: Secondary | ICD-10-CM

## 2014-07-27 MED ORDER — traMADol (ULTRAM) 50 mg tablet
50 | ORAL_TABLET | Freq: Four times a day (QID) | ORAL | Status: AC | PRN
Start: 2014-07-27 — End: 2014-07-27

## 2014-07-27 MED ORDER — naproxen (NAPROSYN) 500 MG tablet
500 | ORAL_TABLET | Freq: Two times a day (BID) | ORAL | Status: AC | PRN
Start: 2014-07-27 — End: 2016-03-15

## 2014-07-27 MED ORDER — oxyCODONE-acetaminophen (PERCOCET) 5-325 mg per tablet 2 tablet
5-325 | Freq: Once | ORAL | Status: AC
Start: 2014-07-27 — End: 2014-07-27
  Administered 2014-07-28: 02:00:00 2 via ORAL

## 2014-07-27 MED ORDER — cyclobenzaprine (FLEXERIL) 10 MG tablet
10 | ORAL_TABLET | Freq: Three times a day (TID) | ORAL | Status: AC | PRN
Start: 2014-07-27 — End: 2016-03-15

## 2014-07-27 MED FILL — OXYCODONE-ACETAMINOPHEN 5 MG-325 MG TABLET: 5-325 5-325 mg | ORAL | Qty: 2

## 2014-07-27 NOTE — Unmapped (Signed)
Pt to [facility] with r ankle injury. Per pt he twisted his ankle last week and has worsening symptoms since

## 2014-07-27 NOTE — Unmapped (Signed)
ED Attending Attestation Note    Date of service:  07/27/2014    This patient was seen by the advanced practice provider.  I have seen and examined the patient, agree with the workup, evaluation, management and diagnosis.  The care plan has been discussed and I concur.      My assessment reveals a 39 y.o. male with mild soft tissue swelling to the medial and lateral malleolus and diffuse tenderness to palpation, although with a grossly stable joint.

## 2014-07-27 NOTE — Unmapped (Signed)
[place] ED Note    Date of service:  07/27/2014    Reason for Visit: Ankle Pain      Patient History     HPI  Paul Maynard. is a 39 year old male who presents to the Emergency Department with 1-week complaint of increasing right ankle pain and limitation in range of motion. He states that he stumbled in a pothole last Monday, and rolled his ankle. Patient states he twisted his low back too.  He has history of back problems.  He denies any presyncopal symptoms, denies loss of consciousness or hitting his head during the fall. Since then, he has experienced increased aching pain in the ankle that sometimes shoots up his leg. He also endorses decreased sensation in the right foot and limited range of motion stating that he is now limping on his right leg, but is still somewhat able to walk.  Patient denies any foot drop.  States the pain as a throbbing aching pain mostly to the medial and lateral aspect of the right ankle.  Patient also complains of low back pain.  He does complain of some numbness and tingling to the right lateral leg.  Denies any weakness of extremities.  States the pain is worse with palpation or walking.  He has not taken anything for pain today.  Denies any neck pain, back pain, chest pain or abdominal pain or head injury.  Denies any other extremity injury.       Past Medical History   Diagnosis Date   ??? Diabetes mellitus        Past Surgical History   Procedure Laterality Date   ??? Hip surgery     ??? Colon surgery     ??? Knee surgery         Patient  reports that he has never smoked. He does not have any smokeless tobacco history on file. He reports that he does not drink alcohol. His drug history is not on file.      Discharge Medication List as of 07/27/2014 10:50 PM      CONTINUE these medications which have NOT CHANGED    Details   gabapentin (NEURONTIN) 800 MG tablet Take 800 mg by mouth 3 times a day., Until Discontinued,  Historical Med      metFORMIN (GLUCOPHAGE) 500 MG tablet Take 500 mg by mouth 2 times a day with meals., Until Discontinued, Historical Med             Allergies:   Allergies as of 07/27/2014 - Fully Reviewed 07/27/2014   Allergen Reaction Noted   ??? Hydrocodone Itching and Nausea And Vomiting 04/09/2011   ??? Ultram [tramadol] Nausea And Vomiting 07/27/2014       Review of Systems     Review of Systems   Constitutional: Negative for fever, chills, activity change, appetite change and fatigue.   HENT: Negative for facial swelling.    Eyes: Negative for pain.   Respiratory: Negative for cough, chest tightness and shortness of breath.    Cardiovascular: Negative for chest pain and palpitations.   Gastrointestinal: Negative for nausea, vomiting, abdominal pain and diarrhea.   Genitourinary: Negative for hematuria and flank pain.   Musculoskeletal: Negative for back pain, joint swelling, gait problem and neck pain.   Skin: Negative for color change, pallor, rash and wound.   Neurological: Negative for dizziness, syncope, weakness, numbness and headaches.           Physical Exam  ED Triage Vitals   Vital Signs Group      Temp 07/27/14 2051 97.7 ??F (36.5 ??C)      Temp Source 07/27/14 2051 Oral      Heart Rate 07/27/14 2051 79      Heart Rate Source 07/27/14 2051 Automatic      Resp 07/27/14 2051 16      SpO2 07/27/14 2051 96 %      BP 07/27/14 2051 118/86 mmHg      BP Location 07/27/14 2051 Left arm      BP Method 07/27/14 2051 Automatic      Patient Position 07/27/14 2051 Sitting   SpO2 07/27/14 2051 96 %   O2 Device 07/27/14 2051 None (Room air)       Physical Exam   Nursing note and vitals reviewed.  Constitutional: He appears well-developed and well-nourished. No distress.   HENT:   Head: Normocephalic and atraumatic.   Right Ear: External ear normal.   Left Ear: External ear normal.   Nose: Nose normal.   Mouth/Throat: Oropharynx is clear and moist.   Eyes: Conjunctivae and EOM are normal. Pupils are equal, round,  and reactive to light. Right eye exhibits no discharge. Left eye exhibits no discharge.   Neck: Normal range of motion. Neck supple. No JVD present. No tracheal deviation present.   No spinous process tenderness of cervical spine.  Full range of motion of neck.   Cardiovascular: Normal rate, regular rhythm, normal heart sounds and intact distal pulses.  Exam reveals no gallop and no friction rub.    No murmur heard.  Pulmonary/Chest: Effort normal and breath sounds normal. No stridor. No respiratory distress. He has no wheezes. He has no rales.   Abdominal: Soft. Bowel sounds are normal. He exhibits no distension. There is no tenderness. There is no rebound and no guarding.   Musculoskeletal: He exhibits tenderness. He exhibits no edema.        Right knee: He exhibits normal range of motion, no swelling, no effusion and no ecchymosis. No tenderness found. No medial joint line and no lateral joint line tenderness noted.        Right ankle: He exhibits decreased range of motion. He exhibits no swelling, no ecchymosis, no deformity, no laceration and normal pulse. Tenderness. Lateral malleolus, medial malleolus and proximal fibula tenderness found. No head of 5th metatarsal tenderness found. Achilles tendon normal. Achilles tendon exhibits no pain, no defect and normal Thompson's test results.        Cervical back: He exhibits normal range of motion, no tenderness and no bony tenderness.        Thoracic back: He exhibits normal range of motion, no tenderness and no bony tenderness.        Lumbar back: He exhibits tenderness and bony tenderness. He exhibits no swelling.   Tenderness to the medial and lateral malleolus of the right ankle and proximal fibula.  Patient does have some limited full inversion of the ankle.  Patient has full range of  motion of bilateral shoulders, elbows, wrists and hands.  Full range of motion bilateral hips, knees, left ankle and feet.  2+ dorsalis pedis and posterior tibial pulse of  bilateral lower extremities.  5 out of 5 strength lower extremities equal bilaterally.   Neurological: He is oriented to person, place and time.  He has normal strength and normal reflexes. No sensory deficit.   Skin: Skin is warm and dry. No rash noted. He is not diaphoretic.  No erythema. No pallor.   Psychiatric: He has a normal mood and affect. His behavior is normal. Judgment and thought content normal.         Diagnostic Studies     Labs:    No tests were performed during this ED visit    Radiology:    Please see electronic medical record for any tests performed in the ED    EKG:    No EKG Performed    Emergency Department Procedures     Procedures    ED Course and MDM     Jahel Wavra. is a 39 y.o. male who presented to the emergency department with Ankle Pain    The patient presents after stumbling and tripping in a pothole last Monday (over one week ago). He denies any presyncopal symptoms and denies hitting his head or any loss of consciousness after the incident. Since the incident, he has experienced increased pain that intermittently radiates superiorly to his right calf, and decreased sensation. He has good dorsalis pedis pulses bilaterally. The patient endorses tenderness to palpation at the proximal fibula and also complained of bilateral back pain since the incident. Given the patient's stated symptoms, xrays of the right foot, ankle, tibia-fibula and lumbar spine were obtained. No acute osseous findings were reported. The patient's symptoms are most likely due to a ligamentous injury. He received Percocet for his pain, which did relieve his symptoms.  Patient also complains of low back pain and did have lumbar spine x-ray which showed no acute fracture.  X-rays of the lumbar spine, right leg, right ankle right foot were all negative for acute fractures.  She is neurovascular intact of both lower extremities.  He was prescribed Naprosyn and Flexeril for his symptoms.  He is referred to ortho  for follow-up.  He has been instructed to return to the emergency department for worsening symptoms, or weakness, tingling, numbness, loss of sensation, or loss of blood supply in the right foot. He has been provided with prescriptions for naproxen and cyclobenzaprine, as well as an air cast and crutches to assist with ambulation. At this time, the patient is stable for discharge to home.  Patient stable for discharge.    1. Right ankle sprain  2. Low back pain     Critical Care Time (Attendings)           Iona Beard, [place]  07/28/14 [phone]

## 2014-07-27 NOTE — Unmapped (Signed)
Please read the attached information for helpful tips to manage your injury at home. Please continue to rest, ice, elevate and wrap your ankle with a compression bandage. You have been provided with crutches to help you ambulate, as needed.   If increase pain, numbness, or weakness, or worse return to ED

## 2014-07-28 ENCOUNTER — Inpatient Hospital Stay
Admit: 2014-07-28 | Discharge: 2014-07-28 | Disposition: A | Discharge status: Home / Self Care | Payer: PRIVATE HEALTH INSURANCE

## 2016-03-15 ENCOUNTER — Emergency Department: Admit: 2016-03-16 | Payer: PRIVATE HEALTH INSURANCE

## 2016-03-15 DIAGNOSIS — M5441 Lumbago with sciatica, right side: Secondary | ICD-10-CM

## 2016-03-15 MED ORDER — naproxen (NAPROSYN) 500 MG tablet
500 | ORAL_TABLET | Freq: Two times a day (BID) | ORAL | 0 refills | Status: AC | PRN
Start: 2016-03-15 — End: ?

## 2016-03-15 MED ORDER — ketorolac (TORADOL) injection 15 mg
15 | Freq: Once | INTRAMUSCULAR | Status: AC
Start: 2016-03-15 — End: 2016-03-15
  Administered 2016-03-16: 02:00:00 15 mg via INTRAMUSCULAR

## 2016-03-15 MED FILL — KETOROLAC 15 MG/ML INJECTION SOLUTION: 15 15 mg/mL | INTRAMUSCULAR | Qty: 1

## 2016-03-15 NOTE — Unmapped (Signed)
[facility] ED Note    Date of service:  03/15/2016    Reason for Visit: Hip Pain and Back Pain      Patient History     RUE:Paul Maynard is a 41 year old male with the blow past medical history as well as a history of IV drug use proximally 6 years ago who presents the emergency department complaining of low back pain.  The patient states that his back pain began approximately 4 days ago, but denies any inciting incident or trauma to the area. He states that he 1st began to notice the back pain on his right-sided lower back, however it is spread across his back and is now shooting down the back of his right leg.  He does endorse some numbness and tingling in his right foot, however does not have any weakness or decreased movement.  He denies any changes to his bowel or bladder habits.  He denies any abdominal pain, nausea, vomiting, fevers, chills, chest pain, shortness of breath.       Past Medical History:   Diagnosis Date   ??? Diabetes mellitus (HCC)        Past Surgical History:   Procedure Laterality Date   ??? COLON SURGERY     ??? HIP SURGERY     ??? KNEE SURGERY         Paul Maynard.  reports that he has never smoked. He has never used smokeless tobacco. He reports that he does not drink alcohol. His drug history is not on file.    Discharge Medication List as of 03/16/2016 12:11 AM      CONTINUE these medications which have NOT CHANGED    Details   INSULIN LISPRO (HUMALOG SUBQ) Inject subcutaneously., Historical Med      metFORMIN (GLUCOPHAGE) 500 MG tablet Take 500 mg by mouth 2 times a day with meals., Until Discontinued, Historical Med             Allergies:   Allergies as of 03/15/2016 - Fully Reviewed 03/15/2016   Allergen Reaction Noted   ??? Hydrocodone Itching and Nausea And Vomiting 04/09/2011   ??? Ultram [tramadol] Nausea And Vomiting 07/27/2014       Review of Systems     ROS:As above or is otherwise negative.      Physical Exam     ED Triage Vitals  [03/15/16 2006]   Vital Signs Group      Temp 97.2 ??F (36.2 ??C)      Temp Source Oral      Heart Rate 78      Heart Rate Source Automatic      Resp 17      SpO2 98 %      BP 103/70      MAP (mmHg)       BP Location Left arm      BP Method Automatic      Patient Position Sitting   SpO2 98 %   O2 Device None (Room air)     General:  Well-developed, well-nourished male, who appears his stated age, in no acute distress    Cardio: Regular rate and rhythm, with no murmurs, rubs or gallops    Respiratory:  Breath sounds vesicular and equal in all lung fields; no wheezes, rales, or rhonchi appreciated; no accessory muscle use    GI:  soft, non-tender, non-distended    MSK: Patient is tender to palpation globally along his lumbar spine midline as well as in  bilateral paraspinal musculature.  He is also tender to palpation in his right-sided buttock.  He has limited range of motion of his trunk and right hip secondary to pain, however has full range of motion of his right knee and right ankle and foot.  He has a positive right-sided logroll for pain.  He is distally neurovascularly intact, though does endorse some tingling in his right-sided foot.  Patient otherwise able to move all 4 extremities equally and without discomfort, distal pulses 2+ in all 4 extremities    Vascular:  Extremities appear well perfused without abnormal hair loss, skin changes or discoloration    Neuro:  Awake, alert and oriented x4; good muscle tone without deficits or atrophy    Psych:  Appropriate mood, affect, and behavior      Diagnostic Studies     Labs:    Please see electronic medical record for any tests performed in the ED     Radiology:    Please see electronic medical record for any tests performed in the ED    EKG:    No EKG Performed    Emergency Department Procedures         ED Course and MDM     Conrado Nance. is a 41 y.o. male who presented to the emergency department with Hip Pain and Back Pain      Paul Maynard is a 41 year old male  with the above past medical history presents the emergency department complaining of low back pain that began approximately 4 days ago with no inciting incident or trauma.  He does endorse radiation of the pain down his right leg with some tingling in his right foot.  He denies any weakness.  He denies any changes to bowel or bladder habits.  On exam, the patient is tender to palpation globally along his lumbar spine and right-sided buttock.  His gait is intact.  He does have positive logroll for pain on the right side and is tender to palpation over his greater trochanter.  He has limited trunk and right-sided hip range of motion secondary to pain, however range of motion of his knee ankle and foot are within normal limits.  He is distally neurovascularly intact, though he does endorse some tingling in his right foot.  X-rays of the lumbar spine, right hip, and pelvis were obtained and were negative for any acute osseous abnormalities.  Given that the patient has a history of IV drug use, a CT scan of his lumbar spine was ordered.  It showed only early mild degenerative changes.  There is no evidence of an epidural abscess or discitis.  CBC and BMP were largely within normal limits.  ESR was mildly elevated to 29, however CRP was normal at 4.3.    Given the patient's benign lab and imaging workup, I have low suspicion for an infectious or bony etiology of the patient's symptoms.  I explained these findings to the patient and his family, and explained that he is likely suffering from nerve impingement or sciatica.  I explained that NSAIDs are the treatment of choice for musculoskeletal back pain.  The patient was given Toradol in the emergency department for his symptoms.  I counseled the patient that I will give him a perception for naproxen which he should take twice daily for the next 7 days and as needed thereafter for pain.  I also counseled the patient to call orthopedics at the number provided if his symptoms  do not improve  in the next 1-2 weeks, as he may need further workup or physical therapy for treatment of his pain.  I also counseled the patient to follow up with his primary care provider or to call primary care provider from the list provided to establish primary care.  The patient verbalizes understanding, is amenable with this plan, and at this time is stable for discharge.  He remained hemodynamic stable throughout his stay in the emergency department.      Critical Care Time (Attendings)        Evangeline Gula, PA  03/16/16 [phone]

## 2016-03-15 NOTE — Unmapped (Signed)
Complains of right hip pain and lower back pain and right foot numbness

## 2016-03-15 NOTE — Unmapped (Signed)
ED Attending Attestation Note    Date of service:  03/15/2016    This patient was seen by the advanced practice provider.  I have seen and examined the patient, agree with the workup, evaluation, management and diagnosis.  The care plan has been discussed and I concur.      My assessment reveals a 41 y.o. male resents with right-sided back pain and sciatica.  No bladder or bowel incontinence no fevers or chills

## 2016-03-16 ENCOUNTER — Inpatient Hospital Stay
Admit: 2016-03-16 | Discharge: 2016-03-16 | Disposition: A | Discharge status: Home / Self Care | Payer: PRIVATE HEALTH INSURANCE

## 2016-03-16 LAB — CBC
Hematocrit: 39.5 % (ref 38.5–50.0)
Hemoglobin: 13.4 g/dL (ref 13.2–17.1)
MCH: 29.7 pg (ref 27.0–33.0)
MCHC: 33.9 g/dL (ref 32.0–36.0)
MCV: 87.6 fL (ref 80.0–100.0)
MPV: 9.9 fL (ref 7.5–11.5)
Platelets: 113 10E3/uL — ABNORMAL LOW (ref 140–400)
RBC: 4.51 10E6/uL (ref 4.20–5.80)
RDW: 13.8 % (ref 11.0–15.0)
WBC: 6.2 10E3/uL (ref 3.8–10.8)

## 2016-03-16 LAB — DIFFERENTIAL
Basophils Absolute: 50 /uL (ref 0–200)
Basophils Relative: 0.8 % (ref 0.0–1.0)
Eosinophils Absolute: 37 /uL (ref 15–500)
Eosinophils Relative: 0.6 % (ref 0.0–8.0)
Lymphocytes Absolute: 1786 /uL (ref 850–3900)
Lymphocytes Relative: 28.8 % (ref 15.0–45.0)
Monocytes Absolute: 304 /uL (ref 200–950)
Monocytes Relative: 4.9 % (ref 0.0–12.0)
Neutrophils Absolute: 4024 /uL (ref 1500–7800)
Neutrophils Relative: 64.9 % (ref 40.0–80.0)
nRBC: 0 /100{WBCs} (ref 0–0)

## 2016-03-16 LAB — BASIC METABOLIC PANEL
Anion Gap: 8 mmol/L (ref 3–16)
BUN: 20 mg/dL (ref 7–25)
CO2: 30 mmol/L (ref 21–33)
Calcium: 9.5 mg/dL (ref 8.6–10.3)
Chloride: 103 mmol/L (ref 98–110)
Creatinine: 0.89 mg/dL (ref 0.60–1.30)
Glucose: 129 mg/dL (ref 70–100)
Osmolality, Calculated: 296 mOsm/kg (ref 278–305)
Potassium: 4.4 mmol/L (ref 3.5–5.3)
Sodium: 141 mmol/L (ref 133–146)
eGFR AA CKD-EPI: >90 See note.
eGFR NONAA CKD-EPI: >90 See note.

## 2016-03-16 LAB — POC GLU MONITORING DEVICE: POC Glucose Monitoring Device: 124 mg/dL — ABNORMAL HIGH (ref 70–100)

## 2016-03-16 LAB — C-REACTIVE PROTEIN: CRP: 4.3 mg/L (ref 1.0–10.0)

## 2016-03-16 LAB — SED RATE: Sed Rate: 29 mm/hr (ref 0–15)

## 2016-03-16 NOTE — ED Notes (Signed)
Discharge instructions reviewed with patient, patient verbalized understanding and denied further needs at this time, Patient ambulated from ED without difficulty.

## 2016-03-16 NOTE — Unmapped (Signed)
The CT scan and x-rays of your back did not show any evidence of fractures, dislocations, or infections.  Your lab workup was negative for any infection.  Your symptoms are likely due to nerve compression and your back, oftentimes called sciatica.  This is treated with NSAIDs, you will be given a prescription for naproxen.  You should take the naproxen twice a day for the next 7 days, and as needed thereafter for pain.  If your pain does not improve in the next 1-2 weeks, you should call to schedule an appointment with orthopedics at the number provided as you may need physical therapy or further evaluation.  Follow-up with your primary care provider or call to establish care with one of the primary care clinics from the list provided.  Return to the emergency department with any new or worsening symptoms.

## 2019-05-20 IMAGING — MR MRI LSPINE WO/W CONTRAST
9 series · 48 of 48 positions shown · IV contrast (prohance)
Comparison: None.

INDICATION: Spinal stenosis of lumbar region without neurogenic claudication.
TECHNIQUE: Multiplanar, multiecho MR imaging of the lumbar spine was performed prior to and following 15  mL ProHance intravenous contrast, including T1-weighted and fluid sensitive sequences.

[Series 11: iii_aaspine_lspine_mpr_cor · coronal · 1.7mm · 1.67mm/px · 14 of 80 slices shown]
[im 1/80]
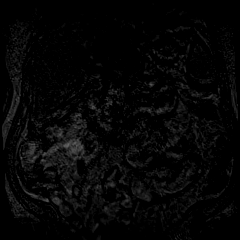
[im 7/80]
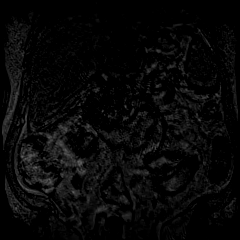
[im 13/80]
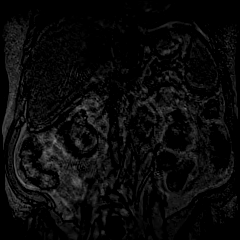
[im 19/80]
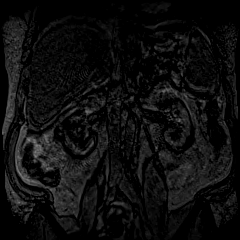
[im 25/80]
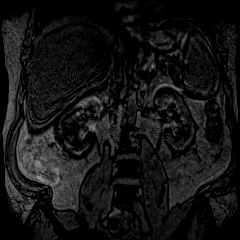
[im 31/80]
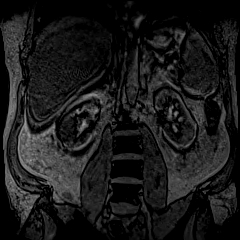
[im 37/80]
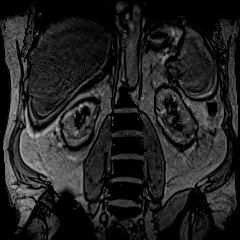
[im 43/80]
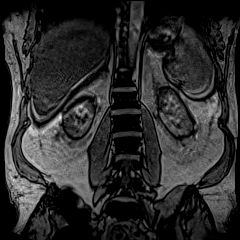
[im 49/80]
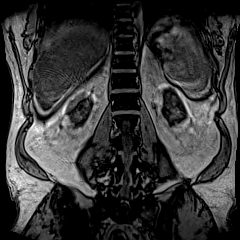
[im 55/80]
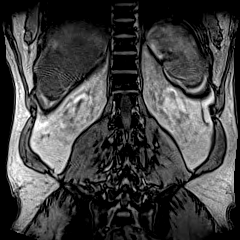
[im 61/80]
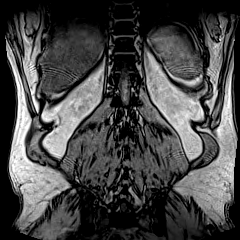
[im 67/80]
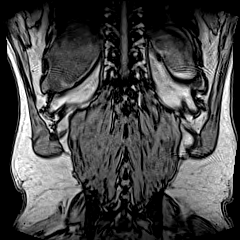
[im 73/80]
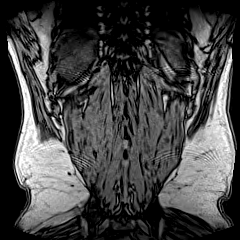
[im 80/80]
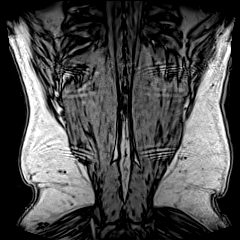

[Series 18: t2_sag · sagittal · 4.0mm · 0.68mm/px · 3 of 15 slices shown]
[im 1/15]
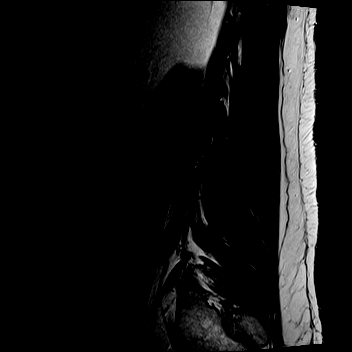
[im 8/15]
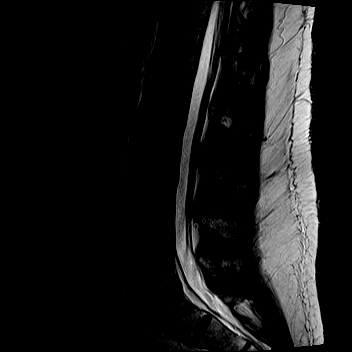
[im 15/15]
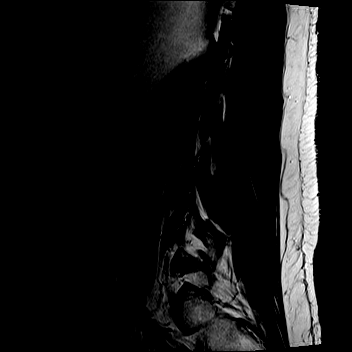

[Series 19: t1_sag · sagittal · 4.0mm · 0.75mm/px · 2 of 15 slices shown]
[im 1/15]
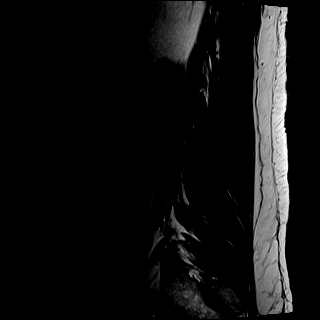
[im 15/15]
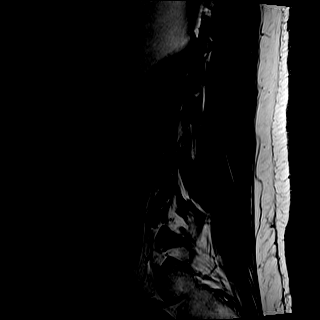

[Series 20: ir_sag · sagittal · 4.0mm · 0.94mm/px · 2 of 15 slices shown]
[im 1/15]
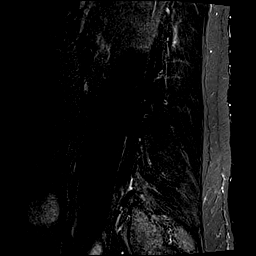
[im 15/15]
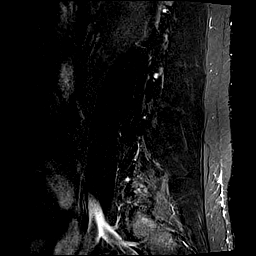

[Series 21: t2_axial · axial · 4.0mm · 0.62mm/px · z∈[-587,-381]mm · 7 of 43 slices shown]
[im 1/43]
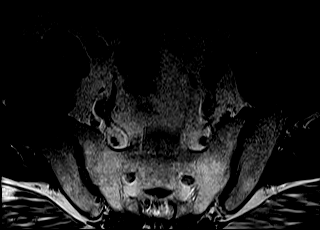
[im 8/43]
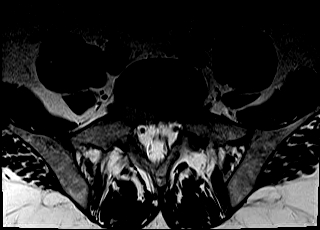
[im 15/43]
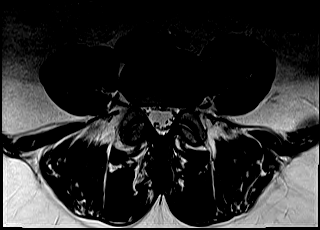
[im 22/43]
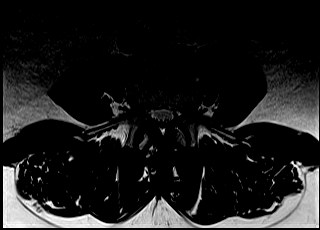
[im 29/43]
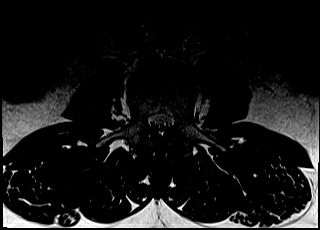
[im 36/43]
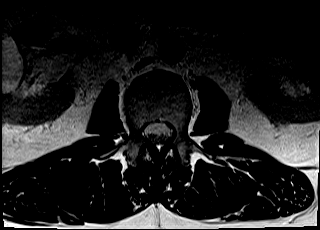
[im 43/43]
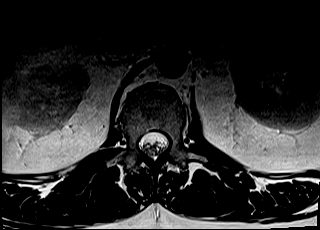

[Series 22: t1_axial_obl · axial · 3.0mm · 0.43mm/px · z∈[-596,-383]mm · 4 of 26 slices shown]
[im 1/26]
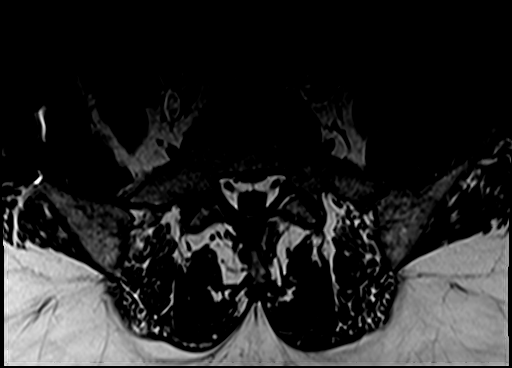
[im 9/26]
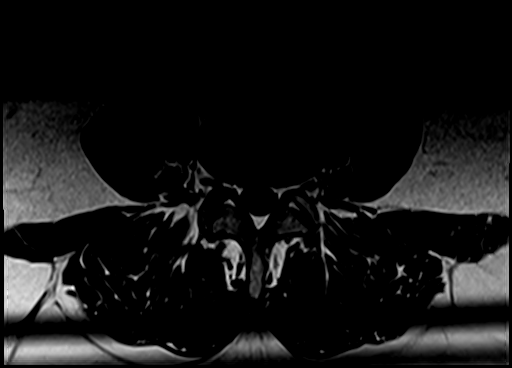
[im 17/26]
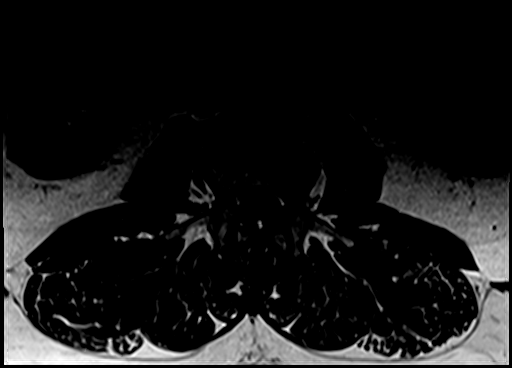
[im 26/26]
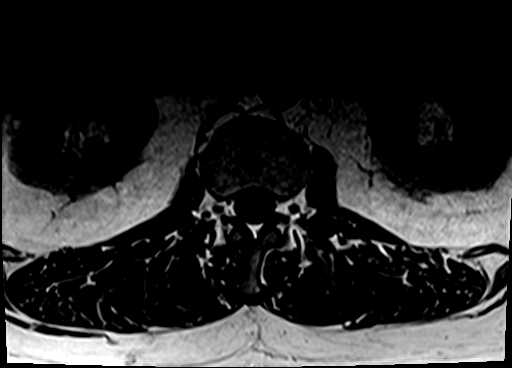

[Series 23: t1_axial_fs_pre · axial · 4.0mm · 0.62mm/px · z∈[-587,-381]mm · 7 of 44 slices shown]
[im 1/44]
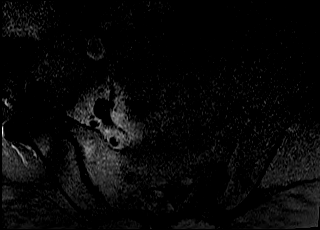
[im 8/44]
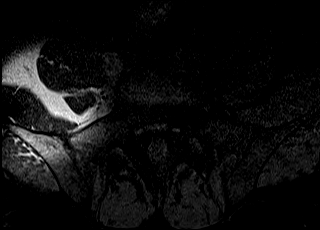
[im 15/44]
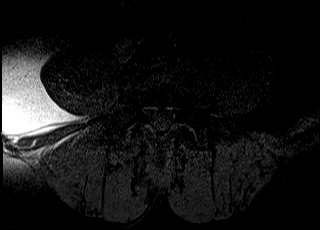
[im 22/44]
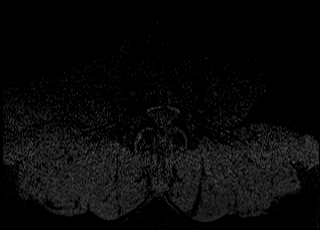
[im 29/44]
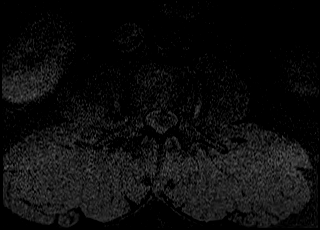
[im 36/44]
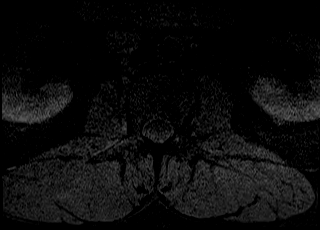
[im 44/44]
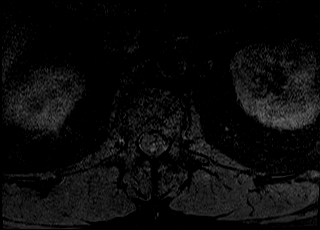

[Series 24: t1_axial_fs+c · axial · 4.0mm · 0.62mm/px · z∈[-587,-381]mm · 7 of 44 slices shown]
[im 1/44]
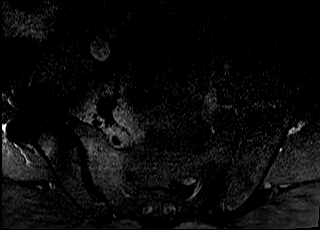
[im 8/44]
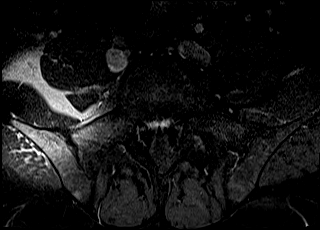
[im 15/44]
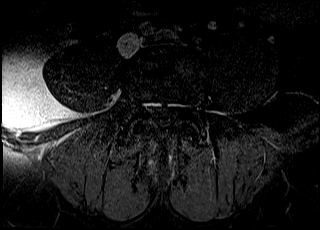
[im 22/44]
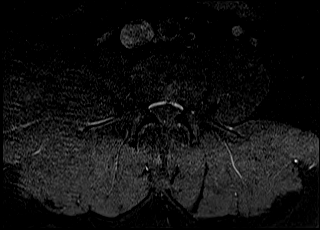
[im 29/44]
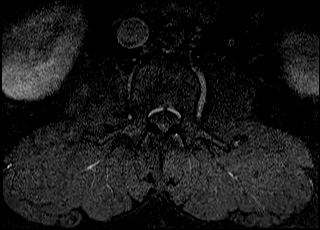
[im 36/44]
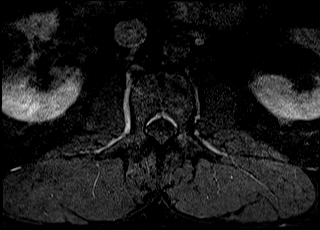
[im 44/44]
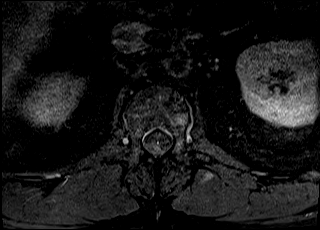

[Series 25: t1_sag_fs_+c · sagittal · 4.0mm · 0.94mm/px · 2 of 15 slices shown]
[im 1/15]
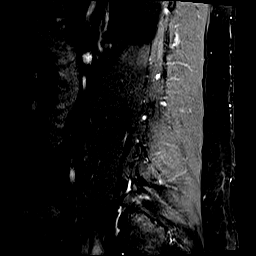
[im 15/15]
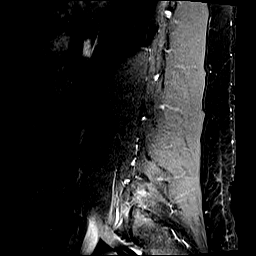

[48 of 48 positions shown; findings below may reference images not displayed]

FINDINGS: Conus: The conus is normal in appearance and position.  

Alignment: The alignment of the lumbar spine is normal on these supine, neutral images.   

Marrow: The visualized bone marrow is normal.

T12-L1: No disc protrusion. No canal or foraminal stenosis.

L1-L2: No disc protrusion, canal stenosis, or foraminal stenosis.

L2-L3: No disc protrusion, canal stenosis, or foraminal stenosis.

L3-L4: Minimal disc bulge. No canal or foraminal stenosis. No facet arthrosis.

L4-L5: Minimal disc bulge. No facet arthrosis. No canal or foraminal stenosis.

L5-S1: Minimal disc bulge. No facet arthrosis. No canal stenosis. Mild left foraminal stenosis. Mild epidural lipomatosis.

Visualized SI joints: Intact

Visualized soft tissues: Right renal cysts, incompletely evaluated.
IMPRESSION: Minimal disc bulge at L5-S1 with mild left foraminal stenosis. No canal stenosis.

## 2020-07-13 IMAGING — MR MRI KNEE LT WO CONTRAST
4 of 5 series · 24 of 40 positions shown · IV contrast (gadolinium)
Comparison: None

HISTORY: Left knee pain and weakness for 3 weeks, loose body.
TECHNIQUE: Multiplanar and multisequence MR imaging of the left knee was performed without the administration of intravenous gadolinium.

[Series 12: t2_axial_fs · axial · left · 3.0mm · 0.45mm/px · z∈[-72,+55]mm · 8 of 33 slices shown]
[im 1/33]
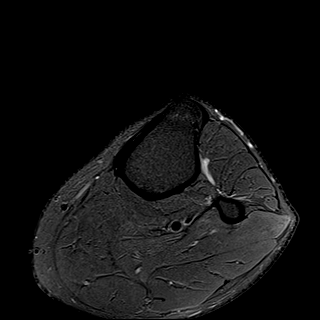
[im 5/33]
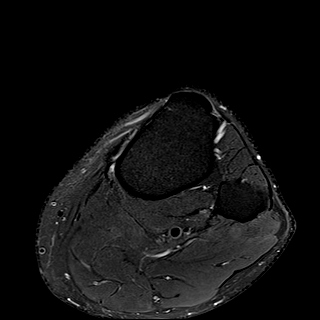
[im 10/33]
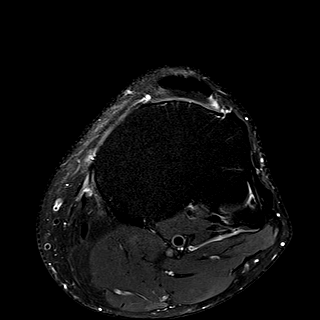
[im 14/33]
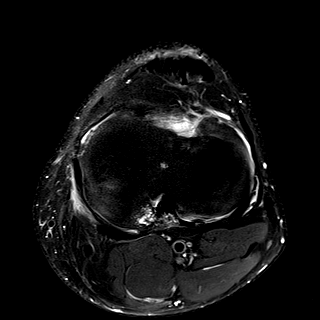
[im 19/33]
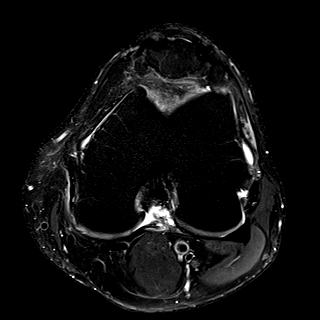
[im 23/33]
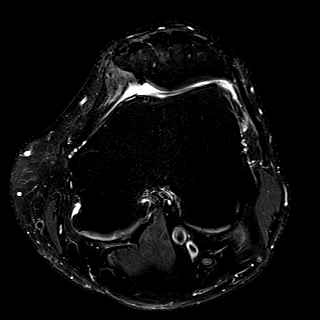
[im 28/33]
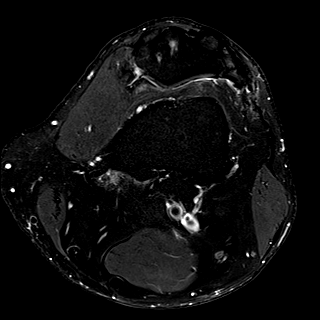
[im 33/33]
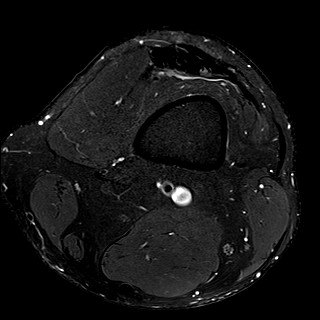

[Series 13: PD · sagittal · left · 3.0mm · 0.23mm/px · 8 of 28 slices shown]
[im 1/28]
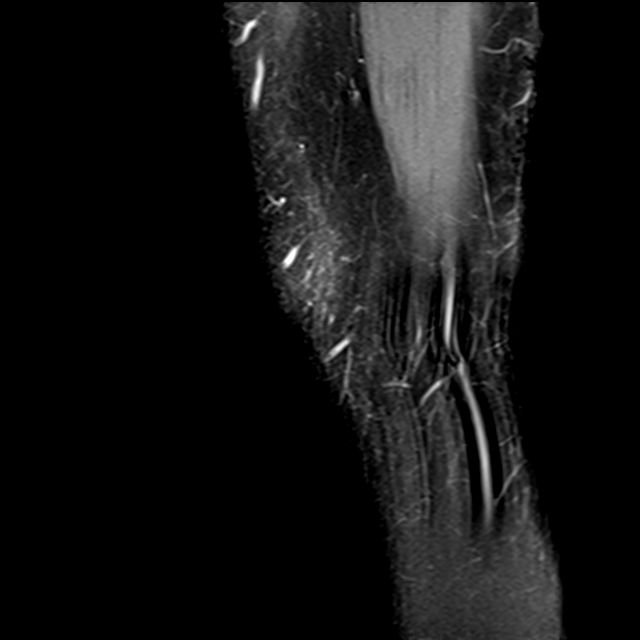
[im 4/28]
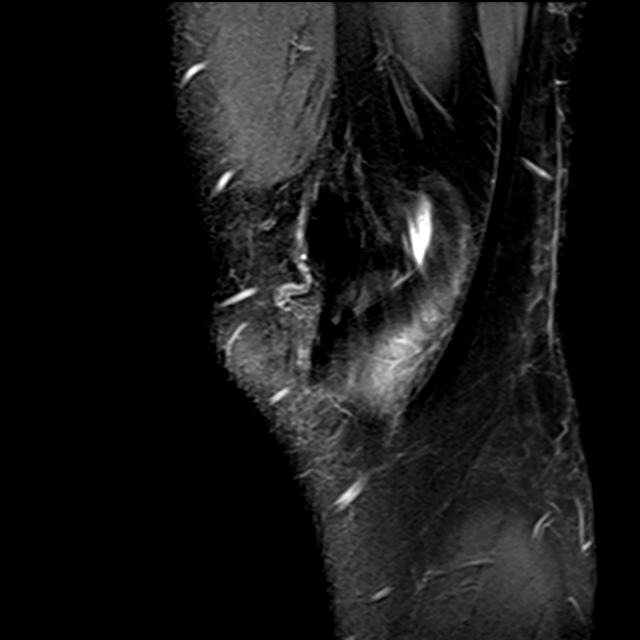
[im 8/28]
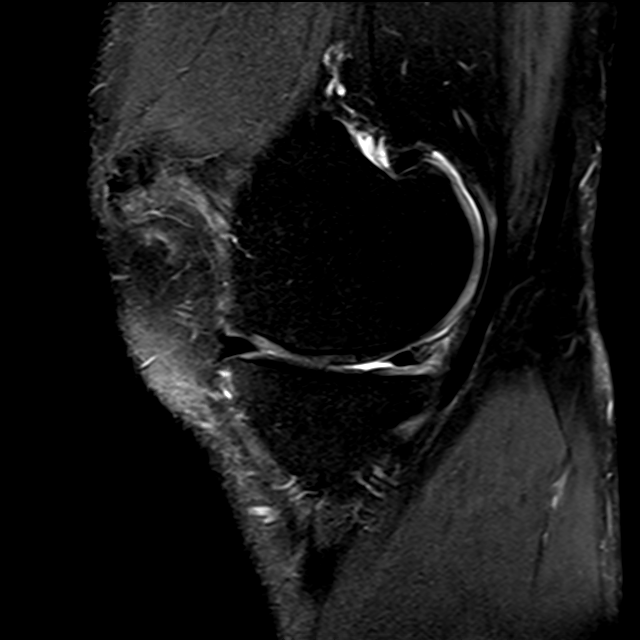
[im 12/28]
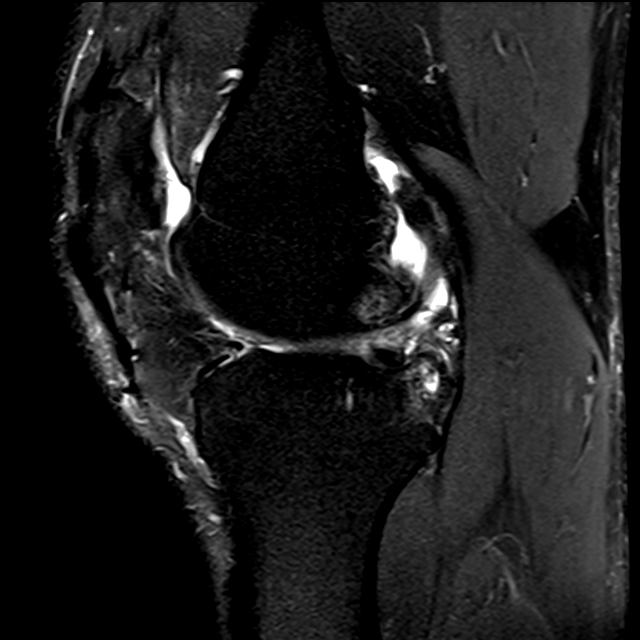
[im 16/28]
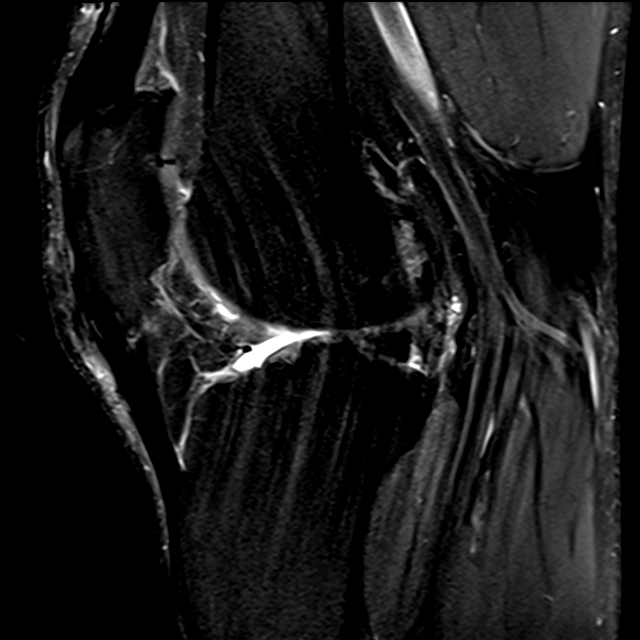
[im 20/28]
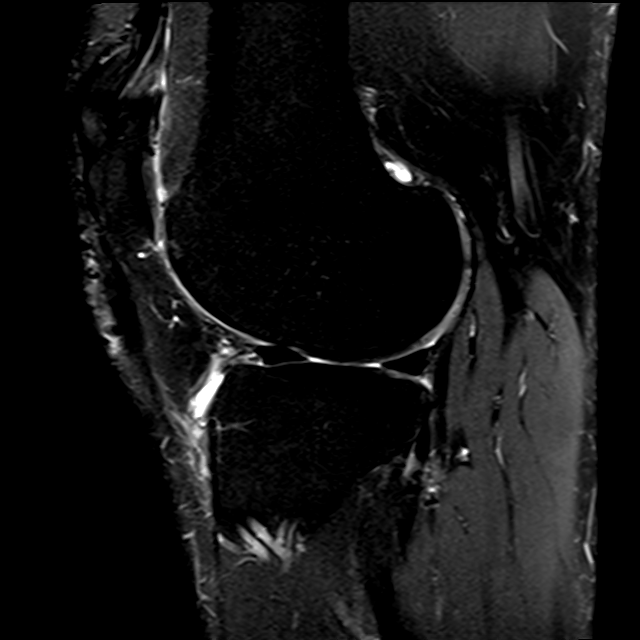
[im 24/28]
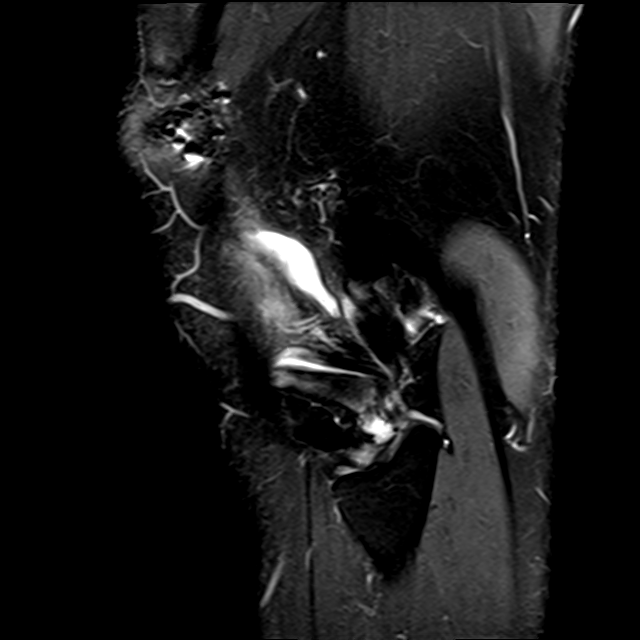
[im 28/28]
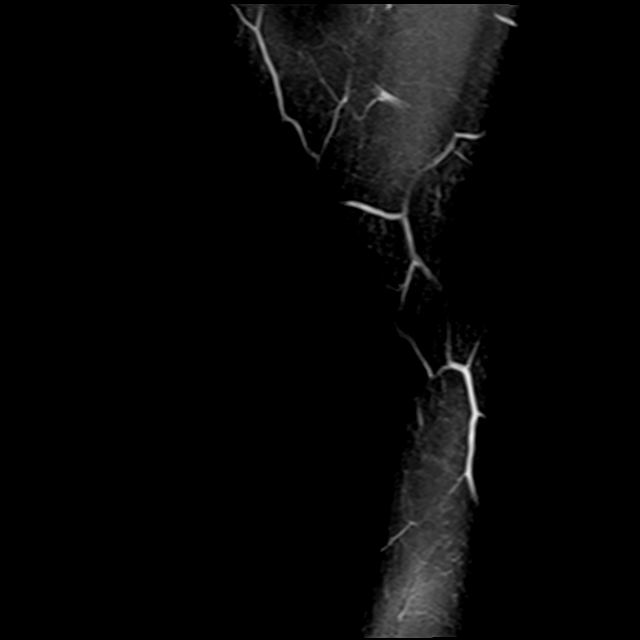

[Series 14: t1_cor · coronal · left · 3.5mm · 0.36mm/px · 5 of 28 slices shown]
[im 1/28]
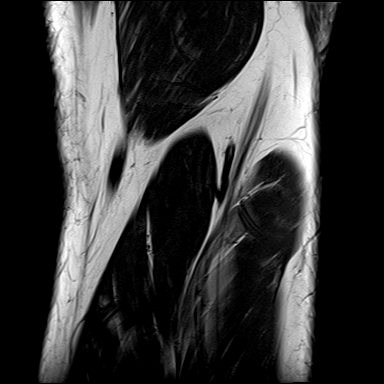
[im 4/28]
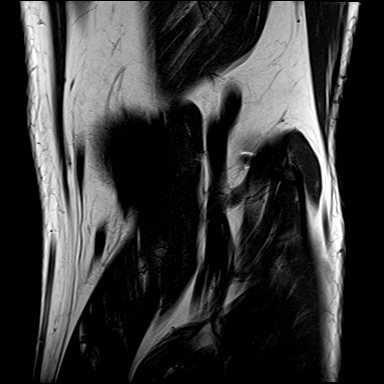
[im 8/28]
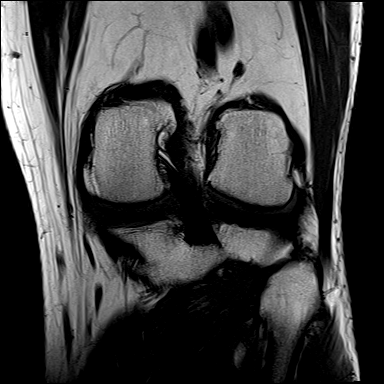
[im 16/28]
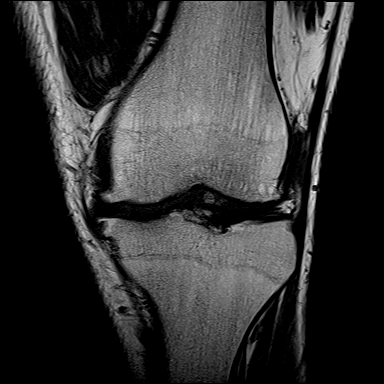
[im 24/28]
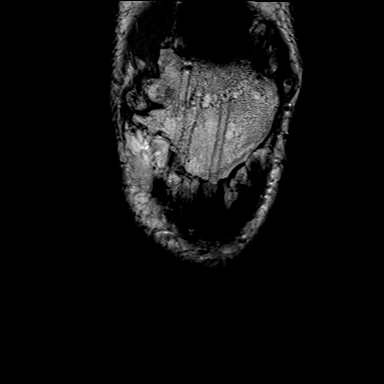

[Series 15: t2_cor_fs · coronal · left · 3.5mm · 0.44mm/px · 3 of 28 slices shown]
[im 4/28]
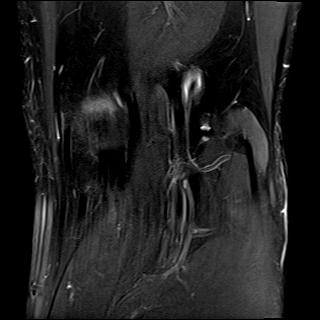
[im 16/28]
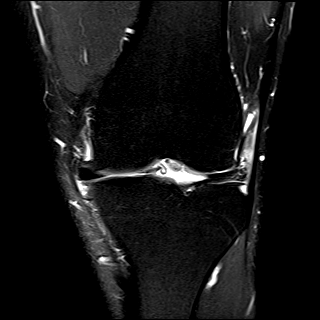
[im 24/28]
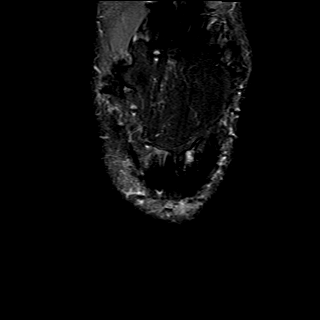

[24 of 40 positions shown; findings below may reference images not displayed]

FINDINGS: The anterior cruciate, posterior cruciate, medial collateral and lateral collateral ligaments are intact.

Truncated appearance posterior horn medial meniscus. Correlate for history of prior partial meniscectomy. There is a horizontal tear posterior horn and body medial meniscus exiting the inferior articular surface.

Lateral meniscus intact.

Small effusion.

Mild to moderate chondral surface irregularity patellofemoral compartment. Postop changes of the patella. Remote healed transverse patellar fracture.

Mild chondral surface irregularity medial and lateral compartments.

Quadriceps and patellar tendons are intact. Surrounding susceptibility artifact consistent with prior surgery. Patellar retinacula are intact.

Signal from muscle normal. Muscle mass normal. Multiseptated ganglion cysts posterior joint space just posterior to the fibers of Chien PCL. No solid lesions. No visible adenopathy.
IMPRESSION: 1. Horizontal tear posterior horn and body medial meniscus exiting the inferior articular surface. Truncated appearance of the posterior horn suggesting previous partial medial meniscectomy.

2. Postoperative changes of the patella. Remote healed transverse patellar fracture. Mild to moderate chondral surface irregularity patellofemoral compartment. Mild DJD medial and lateral compartments.

3. Small effusion.

4. Intact cruciate and collateral ligaments.
